# Patient Record
Sex: Female | Born: 1980 | Race: White | Hispanic: No | Marital: Married | Smoking: Former smoker
Health system: Southern US, Community
[De-identification: ages and names within clinical notes are randomized; demographics above are authoritative.]

## PROBLEM LIST (undated history)

## (undated) ENCOUNTER — Inpatient Hospital Stay (HOSPITAL_COMMUNITY): Payer: Self-pay

## (undated) DIAGNOSIS — E079 Disorder of thyroid, unspecified: Secondary | ICD-10-CM

## (undated) DIAGNOSIS — E039 Hypothyroidism, unspecified: Secondary | ICD-10-CM

## (undated) DIAGNOSIS — R001 Bradycardia, unspecified: Secondary | ICD-10-CM

## (undated) DIAGNOSIS — H409 Unspecified glaucoma: Secondary | ICD-10-CM

## (undated) DIAGNOSIS — J45909 Unspecified asthma, uncomplicated: Secondary | ICD-10-CM

## (undated) DIAGNOSIS — F419 Anxiety disorder, unspecified: Secondary | ICD-10-CM

## (undated) HISTORY — PX: BREAST SURGERY: SHX581

## (undated) HISTORY — DX: Unspecified glaucoma: H40.9

## (undated) HISTORY — PX: OTHER SURGICAL HISTORY: SHX169

---

## 2014-05-14 ENCOUNTER — Emergency Department (HOSPITAL_COMMUNITY)
Admission: EM | Admit: 2014-05-14 | Discharge: 2014-05-14 | Disposition: A | Discharge status: Home / Self Care | Payer: Medicaid Other | Attending: Emergency Medicine | Admitting: Emergency Medicine

## 2014-05-14 DIAGNOSIS — R109 Unspecified abdominal pain: Secondary | ICD-10-CM | POA: Diagnosis present

## 2014-05-14 DIAGNOSIS — R197 Diarrhea, unspecified: Secondary | ICD-10-CM

## 2014-05-14 DIAGNOSIS — Z3202 Encounter for pregnancy test, result negative: Secondary | ICD-10-CM | POA: Insufficient documentation

## 2014-05-14 LAB — COMPREHENSIVE METABOLIC PANEL
ALBUMIN: 3.5 g/dL (ref 3.5–5.2)
ALK PHOS: 94 U/L (ref 39–117)
ALT: 10 U/L (ref 0–35)
ANION GAP: 10 (ref 5–15)
AST: 14 U/L (ref 0–37)
BUN: 11 mg/dL (ref 6–23)
CO2: 23 mEq/L (ref 19–32)
Calcium: 8.9 mg/dL (ref 8.4–10.5)
Chloride: 105 mEq/L (ref 96–112)
Creatinine, Ser: 0.76 mg/dL (ref 0.50–1.10)
GFR calc non Af Amer: >90 mL/min (ref >90)
GLUCOSE: 77 mg/dL (ref 70–99)
POTASSIUM: 4.4 meq/L (ref 3.7–5.3)
SODIUM: 138 meq/L (ref 137–147)
TOTAL PROTEIN: 6.9 g/dL (ref 6.0–8.3)
Total Bilirubin: <0.2 mg/dL — ABNORMAL LOW (ref 0.3–1.2)

## 2014-05-14 LAB — CBC WITH DIFFERENTIAL/PLATELET
BASOS PCT: 0 % (ref 0–1)
Basophils Absolute: 0 10*3/uL (ref 0.0–0.1)
EOS ABS: 0.3 10*3/uL (ref 0.0–0.7)
EOS PCT: 3 % (ref 0–5)
HCT: 41.1 % (ref 36.0–46.0)
Hemoglobin: 13.6 g/dL (ref 12.0–15.0)
Lymphocytes Relative: 37 % (ref 12–46)
Lymphs Abs: 4 10*3/uL (ref 0.7–4.0)
MCH: 30 pg (ref 26.0–34.0)
MCHC: 33.1 g/dL (ref 30.0–36.0)
MCV: 90.5 fL (ref 78.0–100.0)
Monocytes Absolute: 0.6 10*3/uL (ref 0.1–1.0)
Monocytes Relative: 6 % (ref 3–12)
NEUTROS PCT: 54 % (ref 43–77)
Neutro Abs: 5.8 10*3/uL (ref 1.7–7.7)
PLATELETS: 183 10*3/uL (ref 150–400)
RBC: 4.54 MIL/uL (ref 3.87–5.11)
RDW: 13.8 % (ref 11.5–15.5)
WBC: 10.8 10*3/uL — ABNORMAL HIGH (ref 4.0–10.5)

## 2014-05-14 LAB — URINE MICROSCOPIC-ADD ON

## 2014-05-14 LAB — LIPASE, BLOOD: Lipase: 23 U/L (ref 11–59)

## 2014-05-14 LAB — URINALYSIS, ROUTINE W REFLEX MICROSCOPIC
Bilirubin Urine: NEGATIVE
GLUCOSE, UA: NEGATIVE mg/dL
Ketones, ur: NEGATIVE mg/dL
Nitrite: NEGATIVE
PROTEIN: NEGATIVE mg/dL
Specific Gravity, Urine: 1.017 (ref 1.005–1.030)
UROBILINOGEN UA: 0.2 mg/dL (ref 0.0–1.0)
pH: 5.5 (ref 5.0–8.0)

## 2014-05-14 LAB — PREGNANCY, URINE: Preg Test, Ur: NEGATIVE

## 2014-05-14 LAB — TSH: TSH: 9.94 u[IU]/mL — AB (ref 0.350–4.500)

## 2014-05-14 LAB — T4, FREE: Free T4: 1.23 ng/dL (ref 0.80–1.80)

## 2014-05-14 MED ORDER — SODIUM CHLORIDE 0.9 % IV BOLUS (SEPSIS)
1000.0000 mL | Freq: Once | INTRAVENOUS | Status: AC
  Administered 2014-05-14: 1000 mL via INTRAVENOUS

## 2014-05-14 NOTE — ED Notes (Signed)
TO Ed via private vehicle with c/o abd pain and diarrhea.

## 2014-05-14 NOTE — ED Notes (Signed)
Pt states does not want to have a pelvic exam due to religious reasons- states husband is Muslim and he is not here. Denies any vaginal discharge or itching.

## 2014-05-14 NOTE — ED Provider Notes (Signed)
CSN: [id number]     Arrival date & time 05/14/14  0827 [id number]   First MD Initiated Contact with Patient 05/14/14 [phone]     Chief Complaint  Patient presents with  . Abdominal Pain     (Consider location/radiation/quality/duration/timing/severity/associated sxs/prior Treatment) HPI  [name] is a 33 year old female with a past medical history hypothyroidism asthma glaucoma and anxiety coming in with abdominal cramping fever and diarrhea for the past 2 days. Patient states 2 days ago she took a trip to [place] family. She denies eating any bad food prior to onset of symptoms. She denies any other member of her family having similar symptoms. She states that she has bilateral lower quadrant cramping however she's also on her menstrual cycle. She states the cramping is normal is worse than her normal menstrual cycle but her bleeding has not changed. Patient denies any radiation to the pain nothing makes the symptoms better or worse she denies any nausea or vomiting. Patient states she is 5-6 episodes of diarrhea per day that are nonbloody. She states her fever was as high as 101.9 this morning and she took a Motrin at that time. She went to work and had a couple of episodes of bowel movements and they told her to come to emergency department for evaluation. Patient states he has had unprotected sex during the interval with her husband. She denies any history of sexually transmitted infections. Review systems is negative for recent infections recent antibiotic use headache chest pain shortness of breath pain is minor bilateral lower extremities.  Past medical history hypothyroidism anxiety asthma glaucoma. Past surgical history pilodinal abscess removal removal x2.  Medications include Synthroid Celexa. Patient is allergic to penicillin. Patient is a primary care physician. Patient smokes half a pack per day denies abuse of alcohol or illicit drugs.  No past medical history on file. No  past surgical history on file. No family history on file. History  Substance Use Topics  . Smoking status: Not on file  . Smokeless tobacco: Not on file  . Alcohol Use: Not on file   OB History   No data available     Review of Systems 10 Systems reviewed and are negative for acute change except as noted in the HPI.    Allergies  Review of patient's allergies indicates not on file.  Home Medications   Prior to Admission medications   Not on File   BP 106/78  Pulse 77  Temp(Src) 98.5 F (36.9 C) (Oral)  Resp 19  Ht 5\' 3"  (1.6 m)  Wt 175 lb (79.379 kg)  BMI 31.01 kg/m2  SpO2 100% Physical Exam  Nursing note and vitals reviewed. Constitutional: She appears well-developed and well-nourished. No distress.  HENT:  Head: Normocephalic and atraumatic.  Nose: Nose normal.  Mouth/Throat: Oropharynx is clear and moist. No oropharyngeal exudate.  Eyes: Conjunctivae and EOM are normal. Pupils are equal, round, and reactive to light.  Neck: Normal range of motion. Neck supple. No tracheal deviation present. No thyromegaly present.  Cardiovascular: Normal rate, regular rhythm and normal heart sounds.   No murmur heard. Pulmonary/Chest: Effort normal and breath sounds normal. No respiratory distress. She has no wheezes. She has no rales. She exhibits no tenderness.  Abdominal: Soft. There is no rebound and no guarding.  Bilateral lower quadrant tenderness to palpation  Musculoskeletal: Normal range of motion. She exhibits no edema and no tenderness.  Lymphadenopathy:    She has no cervical adenopathy.  Skin: Skin is  warm. No rash noted. She is not diaphoretic. No erythema. No pallor.    ED Course  Procedures (including critical care time) Labs Review Labs Reviewed  CBC WITH DIFFERENTIAL - Abnormal; Notable for the following:    WBC 10.8 (*)    All other components within normal limits  COMPREHENSIVE METABOLIC PANEL - Abnormal; Notable for the following:    Total Bilirubin  <0.2 (*)    All other components within normal limits  URINALYSIS, ROUTINE W REFLEX MICROSCOPIC - Abnormal; Notable for the following:    Color, Urine RED (*)    APPearance CLOUDY (*)    Hgb urine dipstick LARGE (*)    Leukocytes, UA SMALL (*)    All other components within normal limits  WET PREP, GENITAL  GC/CHLAMYDIA PROBE AMP  LIPASE, BLOOD  PREGNANCY, URINE  URINE MICROSCOPIC-ADD ON  TSH  T4, FREE    Imaging Review No results found.   EKG Interpretation None      MDM   Final diagnoses:  None    [name] is a 33 year old female with a past medical history of high thyroidism coming in with bilateral lower quadrant abdominal cramping fever and diarrhea for the past 2 days. Patient is currently on her menstrual cycle and states that her cramping is slightly worse than her normal menstrual cycle. Her bleeding has been normal. Patient remained afebrile here in the emergency department. She stated her fever this morning was 101.9 however here she has not spiked a temperature without any medication given to an emergency department. On exam patient did have some mild tenderness to her bilateral lower quadrants as we obtained laboratory studies urinalysis and pregnancy test. Results as above did not show any significant finding to explain her abdominal cramping and diarrhea. I do not believe that this is a bacterial infection this is likely a viral enteritis. Patient has a mildly elevated white count at 10.8 which is not specific for an acute infection. Patient was given a liter of IV fluids. Pelvic exam on this patient was deferred as she refused the exam. Says that her husband is Muslim and did not want to interfere with any of her cultural customs. I told her that I would not do it diagnosed any sexually transmitted infections nor could I assessed her cervix and adnexa probably about the exam. Pregnancy test was negative. Patient was okay with refusing a pelvic exam at this time.  Current labs pending right now is a TSH and a free T4. All of her of these results prior to discharge. I anticipate the patient will be able to be discharged home and follow up with her primary care physician within a week for continued care. Patient states she has all her home meds at home and did not need any refills. [place] is her primary care physician so ago her referral numbers to obtain one here.  I spoke with the lab and they stated that the laboratory machine for TSH reading is currently down we'll not be on for another couple of hours. The plan will be to discharge the patient and she will be contacted in case these lab values to return abnormal. Patient was encouraged to continue her Synthroid at home and obtain a primary care physician for continued care. Patient was amenable with this plan.  Tomasita Crumble, MD 05/14/14 1154

## 2014-05-14 NOTE — Discharge Instructions (Signed)
Linda Cortez, you were seen today for abdominal pain and diarrhea.  You did not have a fever while in the emergency department.  If it returns, take tylenol or motrin at home.  Follow up with a regular doctor within 2 days for repeat evaluation.  If your symptoms worsen or you develop blood in your stool, return to the ED immediately for repeat evaluation.  Be sure to stay well hydrated until your diarrhea resolves.    Thank you.     Emergency Department Resource Guide 1) Find a Doctor and Pay Out of Pocket Although you won't have to find out who is covered by your insurance plan, it is a good idea to ask around and get recommendations. You will then need to call the office and see if the doctor you have chosen will accept you as a new patient and what types of options they offer for patients who are self-pay. Some doctors offer discounts or will set up payment plans for their patients who do not have insurance, but you will need to ask so you aren't surprised when you get to your appointment.  2) Contact Your Local Health Department Not all health departments have doctors that can see patients for sick visits, but many do, so it is worth a call to see if yours does. If you don't know where your local health department is, you can check in your phone book. The CDC also has a tool to help you locate your state's health department, and many state websites also have listings of all of their local health departments.  3) Find a Walk-in Clinic If your illness is not likely to be very severe or complicated, you may want to try a walk in clinic. These are popping up all over the country in pharmacies, drugstores, and shopping centers. They're usually staffed by nurse practitioners or physician assistants that have been trained to treat common illnesses and complaints. They're usually fairly quick and inexpensive. However, if you have serious medical issues or chronic medical problems, these are probably not  your best option.  No Primary Care Doctor: - Call Health Connect at  [phone] - they can help you locate a primary care doctor that  accepts your insurance, provides certain services, etc. - Physician Referral Service- [phone]  Chronic Pain Problems: Organization         Address  Phone   Notes  [facility]  [phone] Patients need to be referred by their primary care doctor.   Medication Assistance: Organization         Address  Phone   Notes  [facility] Medication [facility] [address] [place], [place] [address] [phone] --Must be a resident of [facility] -- Must have NO insurance coverage whatsoever (no Medicaid/ Medicare, etc.) -- The pt. MUST have a primary care doctor that directs their care regularly and follows them in the community   [facility]  [phone]   [employer]  [phone]    Agencies that provide inexpensive medical care: Organization         Address  Phone   Notes  [facility]  [phone]   [facility]    [phone]   [facility] [address] [place], [place] [address] [phone]   [facility] [address] [place] [place] [phone]   Planned Parenthood    [phone]  [facility]    [phone]   Community [facility] and [facility]  [address], [place] Phone:  [phone], Fax:  [phone] Hours of Operation:  9 am - 6 pm, M-F.  Also accepts Medicaid/Medicare and self-pay.  [facility]  [address], [place] Phone: [phone], Fax: [phone]. Hours of Operation:  8:30 am - 5:30 pm, M-F.  Also accepts Medicaid and self-pay.  [facility] [place] [address] [place] Phone: [phone]   [facility] [address] Natasha Bence [place], [place] [phone], Ext. [phone] Mondays & Thursdays: 7-9 AM.  First  15 patients are seen on a first come, first serve basis.    Medicaid-accepting [facility] Providers:  Organization         Address  Phone   Notes  [facility] [address], [place] [phone] Also accepts self-pay patients.  [facility] [address] Laurell Josephs [place], [place]  [phone]   [facility] [address], [place] [phone]   [facility] [address] [place] [phone]   Renaye Rakers [address], [place]   [phone] Only accepts [place] Access [place] patients after they have their name applied to their card.   Self-Pay (no insurance) in [facility]:  Organization         Address  Phone   Notes  Sickle Cell Patients, [facility] [address] [place], [place] [phone]   [facility] [address] [place], [place] [phone]   [facility] [place]  [address], [place] [phone]   [facility]/Dr. Osei-Bonsu  [address] [place] or [address], [place] [phone] Phone number for both [place] and [place] locations is the same.  Urgent Medical and [facility] [address], [place] [phone]   [facility] [address], [place] or [address] [phone]   [facility] [address] [place] [phone], phone; [phone], fax Sees patients 1st and 3rd Saturday of every month.  Must not qualify for public or private insurance (i.e. Medicaid, Medicare, [place] Health Choice, Veterans' Benefits)  Household income should be no more than 200% of the poverty level The clinic cannot treat you if you are pregnant or think you are pregnant  Sexually transmitted diseases are not treated at the clinic.   Dental  Care: Organization         Address  Phone  Notes  [facility] Department of [facility] [address] [place], [place] [phone] Accepts children up to age 59 who are enrolled in [place] or [place] Health Choice; pregnant women with a Medicaid card; and children who have applied for Medicaid or [place] Health Choice, but were declined, whose parents can pay a reduced fee at time of service.  [facility] Department of [facility]  [address], [place] [phone] Accepts children up to age 82 who are enrolled in [place] or [place] Health Choice; pregnant women with a Medicaid card; and children who have applied for Medicaid or [place] Health Choice, but were declined, whose parents can pay a reduced fee at time of  service.  [facility]  [address], [place] [phone] Patients are seen by appointment only. Walk-ins are not accepted. [facility] will see patients 35 years of age and older. Monday - Tuesday (8am-5pm) Most Wednesdays (8:30-5pm) $30 per visit, cash only  [facility]  [address], [facility] [phone] Patients are seen by appointment only. Walk-ins are not accepted. [facility] will see patients 35 years of age and older. One Wednesday Evening (Monthly: Volunteer Based).  $30 per visit, cash only  [employer] [facility]  [phone] for adults; Children under age 354, call Graduate Pediatric Dentistry at [phone]. Children aged 594-14, please call [phone] to request a pediatric application.  Dental services are provided in all areas of dental care including fillings, crowns and bridges, complete and partial dentures, implants, gum treatment, root canals, and extractions. Preventive care is also provided. Treatment is provided to both adults and children. Patients are selected via a lottery and there is often a waiting list.   [facility] [address], [place]  [phone] [email or web address]   [facility] [address], [place], [place] [phone], Ext. [phone] Second and Fourth Thursday of each month, opens at 6:30 AM; Clinic ends at 9 AM.  Patients are seen on a first-come first-served basis, and a limited number are seen during each clinic.   [facility]  [address], [place], [place] [phone]   Eligibility Requirements You must have lived in [place], [place], or [place] for at least the last three months.   You cannot be eligible for state or federal sponsored [place]healthcare insurance, including CIGNAVeterans Administration, [place]icaid, or [employer]icare.   You generally cannot be eligible for healthcare insurance through your employer.    How to apply: Eligibility screenings are held every Tuesday and Wednesday afternoon from 1:00 pm until 4:00 pm. You do not need an appointment for the interview!  [facility] [address], [place], [place] [phone]   [facility]  [phone]   [facility]  [phone]   [facility]  [phone]    Behavioral Health Resources in the Community: Intensive Outpatient Programs Organization         Address  Phone  Notes  [facility] [address], [place], [place] [phone]   [facility] [address], [place], [place] [phone]   [facility] [address], [place], [place]  [phone]   [facility] [address],  [place], [place] [phone] or [phone]   Substance Abuse Resources Organization         Address  Phone  Notes  [facility]  [phone]   [facility]  [phone]   [facility]  [phone]   [facility]  [phone]   Residential & Outpatient Substance Abuse Program  [phone]    Psychological Services Organization         Address  Phone  Notes  [facility]  [phone]- [phone]   [facility]  [phone]- [phone]   [facility] [address], [place] [phone] or [phone]    Mobile Crisis Teams Organization         Address  Phone  Notes  [facility], Mobile Crisis Care Unit  [phone]   [facility]  [address] Gin[place], [place] [phone]   [facility] [address] [place] [phone]    Self-Help/Support Groups Organization         Address  Phone             Notes  [facility] [place] - variety of support groups  [phone]- [phone] Call for more information  Narcotics Anonymous (NA), [facility] [address], [employer] [place]  2 meetings at this location   Statistician         Address  Phone  Notes  [facility] [address],    [place]  [phone]   [facility]  [address], [place] [address]696, [place], [place] [phone]   [facility] [address] [place], [place] [phone] Admissions: 8am-3pm M-F  [facility] [address],    [place], [place] [phone]   [facility] [address] [place], [place], [place] [phone]   [facility] [address]  [place], [place] [phone]   [facility] - Intensive Outpatient [address] Laurell Joseph[address], [place], [place] [phone]   [facility] ([facility] [address] [place]  [place], [place] [phone] or [phone]   [facility] ([facility]) [address] [place], [place] [phone] Accepts Medicaid  [facility] [place] [address],  [place] [phone] Substance Abuse/Addiction Treatment   [facility] Organization         Address  Phone  Notes  [facility]  [phone]   Angie Fava, PhD [address] Ervin Knack [place], [place]   [phone] or [phone]   [facility] Behavioral   [address] [place], [place] [phone]   [facility] [address], [place], [place] [phone] Insurance/Medicaid/sponsorship through [facility] Families [address]                                    [place], [place] [phone] Therapy/tele-psych/case  [facility] [address] [place] [address], [place] [phone]    Dr. Lolly Mustache  [phone]   [facility]  [employer] [facility] Dept. 1) [address], [place] 2) [address] [place] 3)  [address], [place] [phone]  [phone]   [facility] Child Abuse Hotline ([phone] or [phone] (After Hours)        Abdominal Pain Many things can cause belly (abdominal) pain. Most times, the belly pain is not dangerous. Many cases of belly pain can be watched and treated at home. HOME CARE   Do not take medicines that help you go poop (laxatives) unless told to by your doctor.  Only take medicine as told by your doctor.  Eat or drink as told by your doctor. Your doctor will tell you if you should be on a special diet. GET HELP IF:  You do not know what is causing your belly pain.  You have belly pain while you are sick to your stomach (nauseous) or have runny poop (diarrhea).  You have pain while you pee or poop.  Your belly pain wakes you up at night.  You have belly pain that gets worse or better when you eat.  You  have belly pain that gets worse when you eat fatty foods.  You have a fever. GET HELP RIGHT AWAY IF:   The pain does not go away within 2 hours.  You keep throwing up (vomiting).  The pain changes and is only in the right or left part of the belly.  You have bloody or tarry looking poop. MAKE SURE YOU:   Understand these instructions.  Will  watch your condition.  Will get help right away if you are not doing well or get worse. Document Released: 03/08/2008 Document Revised: 09/25/2013 Document Reviewed: 05/30/2013 [facility] Patient Information 2015 [place], [place]. This information is not intended to replace advice given to you by your health care provider. Make sure you discuss any questions you have with your health care provider. Diarrhea Diarrhea is watery poop (stool). It can make you feel weak, tired, thirsty, or give you a dry mouth (signs of dehydration). Watery poop is a sign of another problem, most often an infection. It often lasts 2-3 days. It can last longer if it is a sign of something serious. Take care of yourself as told by your doctor. HOME CARE   Drink 1 cup (8 ounces) of fluid each time you have watery poop.  Do not drink the following fluids:  Those that contain simple sugars (fructose, glucose, galactose, lactose, sucrose, maltose).  Sports drinks.  Fruit juices.  Whole milk products.  Sodas.  Drinks with caffeine (coffee, tea, soda) or alcohol.  Oral rehydration solution may be used if the doctor says it is okay. You may make your own solution. Follow this recipe:   - teaspoon table salt.   teaspoon baking soda.   teaspoon salt substitute containing potassium chloride.  1 tablespoons sugar.  1 liter (34 ounces) of water.  Avoid the following foods:  High fiber foods, such as raw fruits and vegetables.  Nuts, seeds, and whole grain breads and cereals.   Those that are sweetened with sugar alcohols (xylitol, sorbitol, mannitol).  Try eating the following foods:  Starchy foods, such as rice, toast, pasta, low-sugar cereal, oatmeal, baked potatoes, crackers, and bagels.  Bananas.  Applesauce.  Eat probiotic-rich foods, such as yogurt and milk products that are fermented.  Wash your hands well after each time you have watery poop.  Only take medicine as told by your  doctor.  Take a warm bath to help lessen burning or pain from having watery poop. GET HELP RIGHT AWAY IF:   You cannot drink fluids without throwing up (vomiting).  You keep throwing up.  You have blood in your poop, or your poop looks black and tarry.  You do not pee (urinate) in 6-8 hours, or there is only a small amount of very dark pee.  You have belly (abdominal) pain that gets worse or stays in the same spot (localizes).  You are weak, dizzy, confused, or light-headed.  You have a very bad headache.  Your watery poop gets worse or does not get better.  You have a fever or lasting symptoms for more than 2-3 days.  You have a fever and your symptoms suddenly get worse. MAKE SURE YOU:   Understand these instructions.  Will watch your condition.  Will get help right away if you are not doing well or get worse. Document Released: 03/08/2008 Document Revised: 02/04/2014 Document Reviewed: 05/28/2012 [facility] Patient Information 2015 [place], [place]. This information is not intended to replace advice given to you by your health care provider. Make sure you  discuss any questions you have with your health care provider. ° °

## 2014-07-20 ENCOUNTER — Emergency Department (HOSPITAL_COMMUNITY)
Admission: EM | Admit: 2014-07-20 | Discharge: 2014-07-20 | Disposition: A | Discharge status: Home / Self Care | Payer: Medicaid Other | Attending: Emergency Medicine | Admitting: Emergency Medicine

## 2014-07-20 ENCOUNTER — Encounter (HOSPITAL_COMMUNITY): Payer: Self-pay | Admitting: Emergency Medicine

## 2014-07-20 ENCOUNTER — Emergency Department (HOSPITAL_COMMUNITY): Payer: Medicaid Other

## 2014-07-20 DIAGNOSIS — O200 Threatened abortion: Secondary | ICD-10-CM | POA: Diagnosis not present

## 2014-07-20 DIAGNOSIS — O99331 Smoking (tobacco) complicating pregnancy, first trimester: Secondary | ICD-10-CM | POA: Diagnosis not present

## 2014-07-20 DIAGNOSIS — R102 Pelvic and perineal pain: Secondary | ICD-10-CM | POA: Insufficient documentation

## 2014-07-20 DIAGNOSIS — E079 Disorder of thyroid, unspecified: Secondary | ICD-10-CM | POA: Diagnosis not present

## 2014-07-20 DIAGNOSIS — R1031 Right lower quadrant pain: Secondary | ICD-10-CM | POA: Insufficient documentation

## 2014-07-20 DIAGNOSIS — O209 Hemorrhage in early pregnancy, unspecified: Secondary | ICD-10-CM | POA: Diagnosis present

## 2014-07-20 DIAGNOSIS — F17200 Nicotine dependence, unspecified, uncomplicated: Secondary | ICD-10-CM | POA: Insufficient documentation

## 2014-07-20 DIAGNOSIS — Z880 Allergy status to penicillin: Secondary | ICD-10-CM | POA: Insufficient documentation

## 2014-07-20 DIAGNOSIS — O26851 Spotting complicating pregnancy, first trimester: Secondary | ICD-10-CM

## 2014-07-20 DIAGNOSIS — Z3A01 Less than 8 weeks gestation of pregnancy: Secondary | ICD-10-CM | POA: Diagnosis not present

## 2014-07-20 DIAGNOSIS — R1032 Left lower quadrant pain: Secondary | ICD-10-CM | POA: Insufficient documentation

## 2014-07-20 DIAGNOSIS — Z79899 Other long term (current) drug therapy: Secondary | ICD-10-CM | POA: Insufficient documentation

## 2014-07-20 DIAGNOSIS — O9989 Other specified diseases and conditions complicating pregnancy, childbirth and the puerperium: Secondary | ICD-10-CM | POA: Diagnosis not present

## 2014-07-20 DIAGNOSIS — Z3490 Encounter for supervision of normal pregnancy, unspecified, unspecified trimester: Secondary | ICD-10-CM

## 2014-07-20 HISTORY — DX: Disorder of thyroid, unspecified: E07.9

## 2014-07-20 LAB — URINALYSIS, ROUTINE W REFLEX MICROSCOPIC
Bilirubin Urine: NEGATIVE
Glucose, UA: NEGATIVE mg/dL
HGB URINE DIPSTICK: NEGATIVE
Ketones, ur: NEGATIVE mg/dL
LEUKOCYTES UA: NEGATIVE
Nitrite: NEGATIVE
PH: 6 (ref 5.0–8.0)
Protein, ur: NEGATIVE mg/dL
Specific Gravity, Urine: 1.01 (ref 1.005–1.030)
Urobilinogen, UA: 0.2 mg/dL (ref 0.0–1.0)

## 2014-07-20 LAB — PREGNANCY, URINE: Preg Test, Ur: POSITIVE — AB

## 2014-07-20 LAB — CBC WITH DIFFERENTIAL/PLATELET
Basophils Absolute: 0 10*3/uL (ref 0.0–0.1)
Basophils Relative: 0 % (ref 0–1)
EOS ABS: 0.2 10*3/uL (ref 0.0–0.7)
Eosinophils Relative: 2 % (ref 0–5)
HEMATOCRIT: 36.4 % (ref 36.0–46.0)
HEMOGLOBIN: 12.4 g/dL (ref 12.0–15.0)
Lymphocytes Relative: 39 % (ref 12–46)
Lymphs Abs: 3.8 10*3/uL (ref 0.7–4.0)
MCH: 29.2 pg (ref 26.0–34.0)
MCHC: 34.1 g/dL (ref 30.0–36.0)
MCV: 85.8 fL (ref 78.0–100.0)
MONO ABS: 0.6 10*3/uL (ref 0.1–1.0)
MONOS PCT: 6 % (ref 3–12)
NEUTROS PCT: 53 % (ref 43–77)
Neutro Abs: 5.1 10*3/uL (ref 1.7–7.7)
Platelets: 198 10*3/uL (ref 150–400)
RBC: 4.24 MIL/uL (ref 3.87–5.11)
RDW: 13 % (ref 11.5–15.5)
WBC: 9.7 10*3/uL (ref 4.0–10.5)

## 2014-07-20 LAB — WET PREP, GENITAL
Clue Cells Wet Prep HPF POC: NONE SEEN
TRICH WET PREP: NONE SEEN
Yeast Wet Prep HPF POC: NONE SEEN

## 2014-07-20 LAB — ABO/RH: ABO/RH(D): O POS

## 2014-07-20 LAB — HCG, QUANTITATIVE, PREGNANCY: hCG, Beta Chain, Quant, S: 15607 m[IU]/mL — ABNORMAL HIGH (ref <5)

## 2014-07-20 LAB — OB RESULTS CONSOLE GC/CHLAMYDIA
Chlamydia: NEGATIVE
GC PROBE AMP, GENITAL: NEGATIVE

## 2014-07-20 NOTE — Discharge Instructions (Signed)
Call an OB for further evaluation of your pregnancy. Call for a follow up appointment with a Family or Primary Care Provider.  Return if Symptoms worsen.   Take medication as prescribed.  Pelvic rest for 1 week.

## 2014-07-20 NOTE — ED Provider Notes (Signed)
CSN: [id number]     Arrival date & time 07/20/14  0901 History   First MD Initiated Contact with Patient 07/20/14 [phone]     Chief Complaint  Patient presents with  . Vaginal Bleeding  . Possible Pregnancy     (Consider location/radiation/quality/duration/timing/severity/associated sxs/prior Treatment) HPI Comments: The patient is a 33 year old 125P3A1 female presenting to emergency room at with a chief complaint of abnormal vaginal bleeding since yesterday. The patient reports spotting yesterday with lower abdominal cramping suprapubic and left lower and right lower quadrants. The patient reports mild spotting on toilet paper today. With intermittent abdominal discomfort. Patient reports positive pregnancy test approximately 1.5 weeks ago. Pt unsure last normal menstrual period questionable end of August, early September (6-7 weeks ago). No prenatal care, or immaging.  Patient is a 33 y.o. female presenting with vaginal bleeding and pregnancy problem. The history is provided by the patient. No language interpreter was used.  Vaginal Bleeding Associated symptoms: no dysuria and no nausea   Possible Pregnancy  Primary symptoms include vaginal bleeding.  Associated symptoms include no dysuria, no nausea and no vomiting.    Past Medical History  Diagnosis Date  . Thyroid disease    History reviewed. No pertinent past surgical history. History reviewed. No pertinent family history. History  Substance Use Topics  . Smoking status: Current Every Day Smoker  . Smokeless tobacco: Not on file  . Alcohol Use: No   OB History   Grav Para Term Preterm Abortions TAB SAB Ect Mult Living                 Review of Systems  Gastrointestinal: Negative for nausea, vomiting and diarrhea.  Genitourinary: Positive for vaginal bleeding and pelvic pain. Negative for dysuria.      Allergies  Augmentin and Penicillins  Home Medications   Prior to Admission medications   Medication Sig Start  Date End Date Taking? Authorizing Provider  albuterol (PROVENTIL HFA;VENTOLIN HFA) 108 (90 BASE) MCG/ACT inhaler Inhale 2 puffs into the lungs every 6 (six) hours as needed for wheezing or shortness of breath.   Yes Historical Provider, MD  levothyroxine (SYNTHROID, LEVOTHROID) 175 MCG tablet Take 175 mcg by mouth daily before breakfast.   Yes Historical Provider, MD   BP 116/56  Pulse 68  Temp(Src) 97.8 F (36.6 C) (Oral)  Resp 16  SpO2 100%  LMP 06/04/2014 Physical Exam  Nursing note and vitals reviewed. Constitutional: She is oriented to person, place, and time. She appears well-developed and well-nourished. No distress.  HENT:  Head: Atraumatic.  Cardiovascular: Normal rate and regular rhythm.   Pulmonary/Chest: Effort normal and breath sounds normal. She has no wheezes. She has no rales.  Abdominal: Soft. There is tenderness. There is no rebound and no guarding.  Genitourinary: Cervix exhibits no motion tenderness. Right adnexum displays no mass and no tenderness. Left adnexum displays no mass and no tenderness. No bleeding around the vagina. Vaginal discharge found.  Scant amount of white discharge in posterior vaginal vault. Normal appearance cervix, cervical os is closed. No obvious bleeding. Chaperone present.   Musculoskeletal: Normal range of motion.  Neurological: She is alert and oriented to person, place, and time.  Skin: Skin is warm and dry.  Psychiatric: She has a normal mood and affect. Her behavior is normal.    ED Course  Procedures (including critical care time) Labs Review Results for orders placed during the hospital encounter of 07/20/14  WET PREP, GENITAL      Result Value  Ref Range   Yeast Wet Prep HPF POC NONE SEEN  NONE SEEN   Trich, Wet Prep NONE SEEN  NONE SEEN   Clue Cells Wet Prep HPF POC NONE SEEN  NONE SEEN   WBC, Wet Prep HPF POC FEW (*) NONE SEEN  PREGNANCY, URINE      Result Value Ref Range   Preg Test, Ur POSITIVE (*) NEGATIVE  URINALYSIS,  ROUTINE W REFLEX MICROSCOPIC      Result Value Ref Range   Color, Urine YELLOW  YELLOW   APPearance CLEAR  CLEAR   Specific Gravity, Urine 1.010  1.005 - 1.030   pH 6.0  5.0 - 8.0   Glucose, UA NEGATIVE  NEGATIVE mg/dL   Hgb urine dipstick NEGATIVE  NEGATIVE   Bilirubin Urine NEGATIVE  NEGATIVE   Ketones, ur NEGATIVE  NEGATIVE mg/dL   Protein, ur NEGATIVE  NEGATIVE mg/dL   Urobilinogen, UA 0.2  0.0 - 1.0 mg/dL   Nitrite NEGATIVE  NEGATIVE   Leukocytes, UA NEGATIVE  NEGATIVE  HCG, QUANTITATIVE, PREGNANCY      Result Value Ref Range   hCG, Beta Chain, Quant, S 15607 (*) <5 mIU/mL  CBC WITH DIFFERENTIAL      Result Value Ref Range   WBC 9.7  4.0 - 10.5 K/uL   RBC 4.24  3.87 - 5.11 MIL/uL   Hemoglobin 12.4  12.0 - 15.0 g/dL   HCT 45.4  09.8 - 11.9 %   MCV 85.8  78.0 - 100.0 fL   MCH 29.2  26.0 - 34.0 pg   MCHC 34.1  30.0 - 36.0 g/dL   RDW 14.7  82.9 - 56.2 %   Platelets 198  150 - 400 K/uL   Neutrophils Relative % 53  43 - 77 %   Neutro Abs 5.1  1.7 - 7.7 K/uL   Lymphocytes Relative 39  12 - 46 %   Lymphs Abs 3.8  0.7 - 4.0 K/uL   Monocytes Relative 6  3 - 12 %   Monocytes Absolute 0.6  0.1 - 1.0 K/uL   Eosinophils Relative 2  0 - 5 %   Eosinophils Absolute 0.2  0.0 - 0.7 K/uL   Basophils Relative 0  0 - 1 %   Basophils Absolute 0.0  0.0 - 0.1 K/uL  ABO/RH      Result Value Ref Range   ABO/RH(D) O POS     No rh immune globuloin NOT A RH IMMUNE GLOBULIN CANDIDATE, PT RH POSITIVE     US Ob Comp Less 14 Wks  07/20/2014   CLINICAL DATA:  Pregnant patient with spotting. Quantitative HCG 15,607.  EXAM: OBSTETRIC <14 WK [place] AND TRANSVAGINAL OB US  TECHNIQUE: Both transabdominal and transvaginal ultrasound examinations were performed for complete evaluation of the gestation as well as the maternal uterus, adnexal regions, and pelvic cul-de-sac. Transvaginal technique was performed to assess early pregnancy.  COMPARISON:  None.  FINDINGS: Intrauterine gestational sac: Visualized/normal  in shape. No subchorionic hemorrhage is visualized.  Yolk sac:  Visualized.  Embryo:  Visualized.  Cardiac Activity: Detected.  Heart Rate:  126 bpm  CRL:   5.2  mm   6 w 2 d                  [place] EDC: 03/13/2015  Maternal uterus/adnexae: Unremarkable.  IMPRESSION: Single live intrauterine pregnancy.  No acute finding.   Electronically Signed   By: Drusilla Kanner M.D.   On: 07/20/2014 12:31  [place] Ob Transvaginal  07/20/2014   CLINICAL DATA:  Pregnant patient with spotting. Quantitative HCG 15,607.  EXAM: OBSTETRIC <14 WK US AND TRANSVAGINAL OB US  TECHNIQUE: Both transabdominal and transvaginal ultrasound examinations were performed for complete evaluation of the gestation as well as the maternal uterus, adnexal regions, and pelvic cul-de-sac. Transvaginal technique was performed to assess early pregnancy.  COMPARISON:  None.  FINDINGS: Intrauterine gestational sac: Visualized/normal in shape. No subchorionic hemorrhage is visualized.  Yolk sac:  Visualized.  Embryo:  Visualized.  Cardiac Activity: Detected.  Heart Rate:  126 bpm  CRL:   5.2  mm   6 w 2 d                  [place] EDC: 03/13/2015  Maternal uterus/adnexae: Unremarkable.  IMPRESSION: Single live intrauterine pregnancy.  No acute finding.   Electronically Signed   By: Drusilla Kannerhomas  Dalessio M.D.   On: 07/20/2014 12:31   Imaging Review No results found.   EKG Interpretation None      MDM   Final diagnoses:  Threatened miscarriage in early pregnancy  Intrauterine pregnancy   Patient's with unconfirmed location of pregnancy presents with abnormal vaginal bleeding and lower abdominal pain since yesterday.  Labs, ultrasound ordered. UA shows positive pregnancy, pelvic without obvious bleeding, cervix closed. Beta hCG 15 607, ABO Rh O+ will not administer Rhogam.  Ultrasound confirms intrauterine pregnancy approximately 6 weeks 2 days, heart rate of 126. Plan to followup with OB in 2 days for repeat Beta HCG Quant. Discussed lab results, imaging  results, and treatment plan with the patient. Return precautions given. Reports understanding and no other concerns at this time.  Patient is stable for discharge at this time.      Mellody DrownLauren Naliah Eddington, PA-C 07/21/14 1014

## 2014-07-20 NOTE — ED Notes (Signed)
Per pt sts positive pregnancy test a few weeks ago and now she is having vaginal spotting and cramping.

## 2014-07-20 NOTE — ED Notes (Signed)
Patient transported to Ultrasound 

## 2014-07-22 LAB — GC/CHLAMYDIA PROBE AMP
CT Probe RNA: NEGATIVE
GC Probe RNA: NEGATIVE

## 2014-07-29 NOTE — ED Provider Notes (Signed)
Medical screening examination/treatment/procedure(s) were performed by non-physician practitioner and as supervising physician I was immediately available for consultation/collaboration.   EKG Interpretation None        Stephaniemarie Stoffel N Ticia Virgo, DO 07/29/14 1352 

## 2014-09-05 ENCOUNTER — Ambulatory Visit (INDEPENDENT_AMBULATORY_CARE_PROVIDER_SITE_OTHER): Payer: Medicaid Other | Admitting: Obstetrics & Gynecology

## 2014-09-05 ENCOUNTER — Encounter: Payer: Self-pay | Admitting: Obstetrics & Gynecology

## 2014-09-05 VITALS — BP 108/72 | HR 64 | Temp 98.0°F | Wt 195.0 lb

## 2014-09-05 DIAGNOSIS — O09529 Supervision of elderly multigravida, unspecified trimester: Secondary | ICD-10-CM | POA: Insufficient documentation

## 2014-09-05 DIAGNOSIS — Z3490 Encounter for supervision of normal pregnancy, unspecified, unspecified trimester: Secondary | ICD-10-CM

## 2014-09-05 DIAGNOSIS — O09522 Supervision of elderly multigravida, second trimester: Secondary | ICD-10-CM

## 2014-09-05 DIAGNOSIS — J4520 Mild intermittent asthma, uncomplicated: Secondary | ICD-10-CM

## 2014-09-05 DIAGNOSIS — J45909 Unspecified asthma, uncomplicated: Secondary | ICD-10-CM | POA: Insufficient documentation

## 2014-09-05 DIAGNOSIS — E039 Hypothyroidism, unspecified: Secondary | ICD-10-CM | POA: Insufficient documentation

## 2014-09-05 DIAGNOSIS — E038 Other specified hypothyroidism: Secondary | ICD-10-CM

## 2014-09-05 NOTE — Progress Notes (Signed)
Subjective:    Linda Cortez is being seen today for her first obstetrical visit.  This is a planned pregnancy. She is at 4486w2d gestation. Her obstetrical history is significant for advanced maternal age. Relationship with FOB: spouse, living together. Patient does not intend to breast feed. Pregnancy history fully reviewed.  The information documented in the HPI was reviewed and verified.  Menstrual History: OB History    Gravida Para Term Preterm AB TAB SAB Ectopic Multiple Living   4 3 3       1        Patient's last menstrual period was 06/04/2014 (approximate).    Past Medical History  Diagnosis Date  . Thyroid disease   . Glaucoma     Past Surgical History  Procedure Laterality Date  . Pilonial cyst removed     . Breast surgery      Cyst removal      (Not in a hospital admission) Allergies  Allergen Reactions  . Augmentin [Amoxicillin-Pot Clavulanate] Rash  . Penicillins Rash    History  Substance Use Topics  . Smoking status: Former Games developermoker  . Smokeless tobacco: Not on file  . Alcohol Use: No    Family History  Problem Relation Age of Onset  . Cancer Mother      Review of Systems Constitutional: negative for weight loss Gastrointestinal: negative for vomiting Genitourinary:negative for genital lesions and vaginal discharge and dysuria Musculoskeletal:negative for back pain Behavioral/Psych: negative for abusive relationship, depression, illegal drug usage and tobacco use    Objective:    BP 108/72 mmHg  Pulse 64  Temp(Src) 98 F (36.7 C)  Wt 88.451 kg (195 lb)  LMP 06/04/2014 (Approximate) General Appearance:    Alert, cooperative, no distress, appears stated age  Head:    Normocephalic, without obvious abnormality, atraumatic  Eyes:    PERRL, conjunctiva/corneas clear, EOM's intact, fundi    benign, both eyes  Ears:    Normal TM's and external ear canals, both ears  Nose:   Nares normal, septum midline, mucosa normal, no drainage    or sinus  tenderness  Throat:   Lips, mucosa, and tongue normal; teeth and gums normal  Neck:   Supple, symmetrical, trachea midline, no adenopathy;    thyroid:  no enlargement/tenderness/nodules; no carotid   bruit or JVD  Back:     Symmetric, no curvature, ROM normal, no CVA tenderness  Lungs:     Clear to auscultation bilaterally, respirations unlabored  Chest Wall:    No tenderness or deformity   Heart:    Regular rate and rhythm, S1 and S2 normal, no murmur, rub   or gallop  Breast Exam:    No tenderness, masses, or nipple abnormality  Abdomen:     Soft, non-tender, bowel sounds active all four quadrants,    no masses, no organomegaly  Genitalia:    Normal female without lesion, discharge or tenderness  Extremities:   Extremities normal, atraumatic, no cyanosis or edema  Pulses:   2+ and symmetric all extremities  Skin:   Skin color, texture, turgor normal, no rashes or lesions  Lymph nodes:   Cervical, supraclavicular, and axillary nodes normal  Neurologic:   CNII-XII intact, normal strength, sensation and reflexes    throughout      Lab Review Urine pregnancy test Labs reviewed no Radiologic studies reviewed yes Assessment:    Pregnancy at 986w2d weeks    Plan:      Prenatal vitamins.  Counseling provided regarding continued use  of seat belts, cessation of alcohol consumption, smoking or use of illicit drugs; infection precautions i.e., influenza/TDAP immunizations, toxoplasmosis,CMV, parvovirus, listeria and varicella; workplace safety, exercise during pregnancy; routine dental care, safe medications, sexual activity, hot tubs, saunas, pools, travel, caffeine use, fish and methlymercury, potential toxins, hair treatments, varicose veins Weight gain recommendations per IOM guidelines reviewed: obese/BMI >30->gain  11 - 20 lbs Problem list reviewed and updated. CF mutation testing/NIPT/QUAD SCREEN/fragile XSpinal muscular atrophy discussed Role of ultrasound in pregnancy  discussed Amniocentesis discussed Orders Placed This Encounter  Procedures  . Culture, OB Urine  . Obstetric panel  . HIV antibody  . Hemoglobinopathy evaluation  . Varicella zoster antibody, IgG  . Vit D  25 hydroxy (rtn osteoporosis monitoring)  . TSH  . T4, free  . POCT urinalysis dipstick    Follow up in 4 weeks.

## 2014-09-06 LAB — OBSTETRIC PANEL
ANTIBODY SCREEN: NEGATIVE
BASOS PCT: 0 % (ref 0–1)
Basophils Absolute: 0 10*3/uL (ref 0.0–0.1)
EOS PCT: 2 % (ref 0–5)
Eosinophils Absolute: 0.2 10*3/uL (ref 0.0–0.7)
HEMATOCRIT: 35 % — AB (ref 36.0–46.0)
HEMOGLOBIN: 11.8 g/dL — AB (ref 12.0–15.0)
Hepatitis B Surface Ag: NEGATIVE
Lymphocytes Relative: 28 % (ref 12–46)
Lymphs Abs: 2.7 10*3/uL (ref 0.7–4.0)
MCH: 29.1 pg (ref 26.0–34.0)
MCHC: 33.7 g/dL (ref 30.0–36.0)
MCV: 86.2 fL (ref 78.0–100.0)
MONO ABS: 0.5 10*3/uL (ref 0.1–1.0)
MONOS PCT: 5 % (ref 3–12)
MPV: 11 fL (ref 9.4–12.4)
NEUTROS ABS: 6.2 10*3/uL (ref 1.7–7.7)
Neutrophils Relative %: 65 % (ref 43–77)
PLATELETS: 167 10*3/uL (ref 150–400)
RBC: 4.06 MIL/uL (ref 3.87–5.11)
RDW: 13.8 % (ref 11.5–15.5)
RUBELLA: 2.07 {index} — AB (ref <0.90)
Rh Type: POSITIVE
WBC: 9.6 10*3/uL (ref 4.0–10.5)

## 2014-09-06 LAB — TSH: TSH: 2.788 u[IU]/mL (ref 0.350–4.500)

## 2014-09-06 LAB — T4, FREE: Free T4: 1.31 ng/dL (ref 0.80–1.80)

## 2014-09-06 LAB — VARICELLA ZOSTER ANTIBODY, IGG: VARICELLA IGG: 207.7 {index} — AB (ref <135.00)

## 2014-09-06 LAB — HIV ANTIBODY (ROUTINE TESTING W REFLEX): HIV 1&2 Ab, 4th Generation: NONREACTIVE

## 2014-09-06 LAB — VITAMIN D 25 HYDROXY (VIT D DEFICIENCY, FRACTURES): Vit D, 25-Hydroxy: 15 ng/mL — ABNORMAL LOW (ref 30–100)

## 2014-09-07 LAB — CULTURE, OB URINE: Colony Count: 3000

## 2014-09-07 NOTE — Patient Instructions (Signed)
Prenatal Care  WHAT IS PRENATAL CARE?  Prenatal care means health care during your pregnancy, before your baby is born. It is very important to take care of yourself and your baby during your pregnancy by:   Getting early prenatal care. If you know you are pregnant, or think you might be pregnant, call your health care provider as soon as possible. Schedule a visit for a prenatal exam.  Getting regular prenatal care. Follow your health care provider's schedule for blood and other necessary tests. Do not miss appointments.  Doing everything you can to keep yourself and your baby healthy during your pregnancy.  Getting complete care. Prenatal care should include evaluation of the medical, dietary, educational, psychological, and social needs of you and your significant other. The medical and genetic history of your family and the family of your baby's father should be discussed with your health care provider.  Discussing with your health care provider:  Prescription, over-the-counter, and herbal medicines that you take.  Any history of substance abuse, alcohol use, smoking, and illegal drug use.  Any history of domestic abuse and violence.  Immunizations you have received.  Your nutrition and diet.  The amount of exercise you do.  Any environmental and occupational hazards to which you are exposed.  History of sexually transmitted infections for both you and your partner.  Previous pregnancies you have had. WHY IS PRENATAL CARE SO IMPORTANT?  By regularly seeing your health care provider, you help ensure that problems can be identified early so that they can be treated as soon as possible. Other problems might be prevented. Many studies have shown that early and regular prenatal care is important for the health of mothers and their babies.  HOW CAN I TAKE CARE OF MYSELF WHILE I AM PREGNANT?  Here are ways to take care of yourself and your baby:   Start or continue taking your  multivitamin with 400 micrograms (mcg) of folic acid every day.  Get early and regular prenatal care. It is very important to see a health care provider during your pregnancy. Your health care provider will check at each visit to make sure that you and your baby are healthy. If there are any problems, action can be taken right away to help you and your baby.  Eat a healthy diet that includes:  Fruits.  Vegetables.  Foods low in saturated fat.  Whole grains.  Calcium-rich foods, such as milk, yogurt, and hard cheeses.  Drink 6-8 glasses of liquids a day.  Unless your health care provider tells you not to, try to be physically active for 30 minutes, most days of the week. If you are pressed for time, you can get your activity in through 10-minute segments, three times a day.  Do not smoke, drink alcohol, or use drugs. These can cause long-term damage to your baby. Talk with your health care provider about steps to take to stop smoking. Talk with a member of your faith community, a counselor, a trusted friend, or your health care provider if you are concerned about your alcohol or drug use.  Ask your health care provider before taking any medicine, even over-the-counter medicines. Some medicines are not safe to take during pregnancy.  Get plenty of rest and sleep.  Avoid hot tubs and saunas during pregnancy.  Do not have X-rays taken unless absolutely necessary and with the recommendation of your health care provider. A lead shield can be placed on your abdomen to protect your baby when   X-rays are taken in other parts of your body.  Do not empty the cat litter when you are pregnant. It may contain a parasite that causes an infection called toxoplasmosis, which can cause birth defects. Also, use gloves when working in garden areas used by cats.  Do not eat uncooked or undercooked meats or fish.  Do not eat soft, mold-ripened cheeses (Brie, Camembert, and chevre) or soft, blue-veined  cheese (Danish blue and Roquefort).  Stay away from toxic chemicals like:  Insecticides.  Solvents (some cleaners or paint thinners).  Lead.  Mercury.  Sexual intercourse may continue until the end of the pregnancy, unless you have a medical problem or there is a problem with the pregnancy and your health care provider tells you not to.  Do not wear high-heel shoes, especially during the second half of the pregnancy. You can lose your balance and fall.  Do not take long trips, unless absolutely necessary. Be sure to see your health care provider before going on the trip.  Do not sit in one position for more than 2 hours when on a trip.  Take a copy of your medical records when going on a trip. Know where a hospital is located in the city you are visiting, in case of an emergency.  Most dangerous household products will have pregnancy warnings on their labels. Ask your health care provider about products if you are unsure.  Limit or eliminate your caffeine intake from coffee, tea, sodas, medicines, and chocolate.  Many women continue working through pregnancy. Staying active might help you stay healthier. If you have a question about the safety or the hours you work at your particular job, talk with your health care provider.  Get informed:  Read books.  Watch videos.  Go to childbirth classes for you and your significant other.  Talk with experienced moms.  Ask your health care provider about childbirth education classes for you and your partner. Classes can help you and your partner prepare for the birth of your baby.  Ask about a baby doctor (pediatrician) and methods and pain medicine for labor, delivery, and possible cesarean delivery. HOW OFTEN SHOULD I SEE MY HEALTH CARE PROVIDER DURING PREGNANCY?  Your health care provider will give you a schedule for your prenatal visits. You will have visits more often as you get closer to the end of your pregnancy. An average  pregnancy lasts about 40 weeks.  A typical schedule includes visiting your health care provider:   About once each month during your first 6 months of pregnancy.  Every 2 weeks during the next 2 months.  Weekly in the last month, until the delivery date. Your health care provider will probably want to see you more often if:  You are older than 35 years.  Your pregnancy is high risk because you have certain health problems or problems with the pregnancy, such as:  Diabetes.  High blood pressure.  The baby is not growing on schedule, according to the dates of the pregnancy. Your health care provider will do special tests to make sure you and your baby are not having any serious problems. WHAT HAPPENS DURING PRENATAL VISITS?   At your first prenatal visit, your health care provider will do a physical exam and talk to you about your health history and the health history of your partner and your family. Your health care provider will be able to tell you what date to expect your baby to be born on.  Your   first physical exam will include checks of your blood pressure, measurements of your height and weight, and an exam of your pelvic organs. Your health care provider will do a Pap test if you have not had one recently and will do cultures of your cervix to make sure there is no infection.  At each prenatal visit, there will be tests of your blood, urine, blood pressure, weight, and the progress of the baby will be checked.  At your later prenatal visits, your health care provider will check how you are doing and how your baby is developing. You may have a number of tests done as your pregnancy progresses.  Ultrasound exams are often used to check on your baby's growth and health.  You may have more urine and blood tests, as well as special tests, if needed. These may include amniocentesis to examine fluid in the pregnancy sac, stress tests to check how the baby responds to contractions, or a  biophysical profile to measure your baby's well-being. Your health care provider will explain the tests and why they are necessary.  You should be tested for high blood sugar (gestational diabetes) between the 24th and 28th weeks of your pregnancy.  You should discuss with your health care provider your plans to breastfeed or bottle-feed your baby.  Each visit is also a chance for you to learn about staying healthy during pregnancy and to ask questions. Document Released: 09/23/2003 Document Revised: 09/25/2013 Document Reviewed: 12/05/2013 Exit[employer] Patient Information 2015 [employer]. This information is not intended to replace advice given to you by your health care provider. Make sure you discuss any questions you have with your health care provider.  

## 2014-09-09 LAB — PAP IG, CT-NG NAA, HPV HIGH-RISK
Chlamydia Probe Amp: NEGATIVE
GC PROBE AMP: NEGATIVE
HPV DNA High Risk: NOT DETECTED

## 2014-09-09 LAB — HEMOGLOBINOPATHY EVALUATION
HGB A2 QUANT: 2.5 % (ref 2.2–3.2)
HGB A: 97.5 % (ref 96.8–97.8)
Hemoglobin Other: 0 %
Hgb F Quant: 0 % (ref 0.0–2.0)
Hgb S Quant: 0 %

## 2014-09-10 ENCOUNTER — Other Ambulatory Visit: Payer: Self-pay | Admitting: *Deleted

## 2014-09-10 ENCOUNTER — Encounter: Payer: Self-pay | Admitting: Obstetrics & Gynecology

## 2014-09-10 DIAGNOSIS — E559 Vitamin D deficiency, unspecified: Secondary | ICD-10-CM | POA: Insufficient documentation

## 2014-09-10 MED ORDER — OB COMPLETE PETITE 35-5-1-200 MG PO CAPS: 1.0000 | ORAL_CAPSULE | Freq: Every day | ORAL | Status: DC

## 2014-09-10 NOTE — Progress Notes (Signed)
Pt called regarding lab results.  Pt made aware of recommendations.  Prenatal vitamin sent to pharmacy.

## 2014-09-10 NOTE — Progress Notes (Signed)
Quick Note:  Needs PNV w/vitamin D ______ 

## 2014-09-17 ENCOUNTER — Encounter: Payer: Self-pay | Admitting: Obstetrics

## 2014-09-30 ENCOUNTER — Encounter: Payer: Self-pay | Admitting: *Deleted

## 2014-10-01 ENCOUNTER — Encounter: Payer: Self-pay | Admitting: Obstetrics & Gynecology

## 2014-10-02 ENCOUNTER — Encounter: Payer: Self-pay | Admitting: Obstetrics

## 2014-10-02 ENCOUNTER — Ambulatory Visit (INDEPENDENT_AMBULATORY_CARE_PROVIDER_SITE_OTHER): Payer: Medicaid Other | Admitting: Obstetrics

## 2014-10-02 VITALS — BP 99/60 | HR 69 | Wt 190.0 lb

## 2014-10-02 DIAGNOSIS — R112 Nausea with vomiting, unspecified: Secondary | ICD-10-CM

## 2014-10-02 DIAGNOSIS — IMO0002 Reserved for concepts with insufficient information to code with codable children: Secondary | ICD-10-CM

## 2014-10-02 DIAGNOSIS — O09522 Supervision of elderly multigravida, second trimester: Secondary | ICD-10-CM

## 2014-10-02 DIAGNOSIS — Z0489 Encounter for examination and observation for other specified reasons: Secondary | ICD-10-CM

## 2014-10-02 DIAGNOSIS — B3731 Acute candidiasis of vulva and vagina: Secondary | ICD-10-CM

## 2014-10-02 DIAGNOSIS — Z36 Encounter for antenatal screening of mother: Secondary | ICD-10-CM

## 2014-10-02 DIAGNOSIS — B373 Candidiasis of vulva and vagina: Secondary | ICD-10-CM

## 2014-10-02 LAB — POCT URINALYSIS DIPSTICK
BILIRUBIN UA: NEGATIVE
Blood, UA: 50
Glucose, UA: NEGATIVE
KETONES UA: NEGATIVE
NITRITE UA: NEGATIVE
PH UA: 6
Protein, UA: NEGATIVE
Spec Grav, UA: 1.02
Urobilinogen, UA: NEGATIVE

## 2014-10-02 MED ORDER — ONDANSETRON 4 MG PO TBDP: 4.0000 mg | ORAL_TABLET | Freq: Three times a day (TID) | ORAL | Status: DC | PRN

## 2014-10-02 MED ORDER — FLUCONAZOLE 150 MG PO TABS: 150.0000 mg | ORAL_TABLET | Freq: Once | ORAL | Status: DC

## 2014-10-03 ENCOUNTER — Encounter: Payer: Self-pay | Admitting: Obstetrics

## 2014-10-03 NOTE — Progress Notes (Signed)
  Subjective:    Linda Cortez is a 33 y.o. female being seen today for her obstetrical visit. She is at 9069w2d gestation. Patient reports: nausea and vomiting.  Problem List Items Addressed This Visit    Elderly multigravida - Primary   Relevant Orders      AMB Referral to Maternal Fetal Medicine (MFM)    Other Visit Diagnoses    Candida vaginitis        Relevant Medications       fluconazole (DIFLUCAN) tablet 150 mg    Nausea and vomiting, vomiting of unspecified type        Relevant Medications       ondansetron (ZOFRAN-ODT) disintegrating tablet    Evaluate anatomy not seen on prior sonogram        Relevant Orders       US OB Comp + 14 Wk      Patient Active Problem List   Diagnosis Date Noted  . Vitamin D deficiency 09/10/2014  . Elderly multigravida 09/05/2014  . Asthma 09/05/2014  . Hypothyroidism 09/05/2014    Objective:     BP 99/60 mmHg  Pulse 69  Wt 190 lb (86.183 kg)  LMP 06/04/2014 (Approximate) Uterine Size: Below umbilicus     Assessment:    Pregnancy @ 6769w2d  weeks Doing well    Plan:    Problem list reviewed and updated. Labs reviewed.  Follow up in 4 weeks. FIRST/CF mutation testing/NIPT/QUAD SCREEN/fragile X/Ashkenazi Jewish population testing/Spinal muscular atrophy discussed: requested. Role of ultrasound in pregnancy discussed; fetal survey: requested. Amniocentesis discussed: Genetic Counseling pending.

## 2014-10-04 NOTE — L&D Delivery Note (Addendum)
Delivery Note At 7:39 PM a viable female was delivered via Vaginal, Spontaneous Delivery (Presentation: Middle Occiput Anterior).  APGAR: 8, 9; weight: 3565 grams  .   Placenta status: spontaneous, shultz.  Cord:  with the following complications: None.  Cord pH: not done.    This is a 34 year old G 5 P3 who was admitted for Not in labor.. She progressed normally with one dose of Cytotec and low dose pitocin, had epidural one hour prior to the second stage of labor.  She pushed for 25 min.  She delivered a viable infant female, cephalic, over an intact perineum.  A nuchal cord  was not identified.  Infant was placed on maternal abdomen.  Delayed cord clamping done for about 2.5 minutes. Cord double clamped and cut.  Apgar scores were 8 and 9. The placenta delivered spontaneously, shultz, with a 3 vessel cord.  Inspection revealed 1st degree and left labial. The uterus was firm bleeding stable.  EBL was 200.    Placenta and umbilical artery blood gas were not sent.  There were no complications during the procedure.  Mom and baby skin to skin following delivery. Left in stable condition.    Anesthesia: Epidural  Episiotomy: None Lacerations:  1st degree right labial Suture Repair: none Est. Blood Loss (mL):  200  Mom to postpartum.  Baby to Couplet care / Skin to Skin.  Roe Coombsachelle A Denney, CNM 03/04/2015, 8:02 PM

## 2014-10-08 ENCOUNTER — Other Ambulatory Visit (HOSPITAL_COMMUNITY): Payer: Self-pay | Admitting: Obstetrics and Gynecology

## 2014-10-08 ENCOUNTER — Ambulatory Visit (HOSPITAL_COMMUNITY)
Admission: RE | Admit: 2014-10-08 | Discharge: 2014-10-08 | Disposition: A | Discharge status: Home / Self Care | Payer: Medicaid Other | Source: Ambulatory Visit | Attending: Obstetrics | Admitting: Obstetrics

## 2014-10-08 ENCOUNTER — Encounter (HOSPITAL_COMMUNITY): Payer: Self-pay

## 2014-10-08 ENCOUNTER — Other Ambulatory Visit: Payer: Self-pay | Admitting: Obstetrics

## 2014-10-08 DIAGNOSIS — IMO0002 Reserved for concepts with insufficient information to code with codable children: Secondary | ICD-10-CM

## 2014-10-08 DIAGNOSIS — O09522 Supervision of elderly multigravida, second trimester: Secondary | ICD-10-CM | POA: Insufficient documentation

## 2014-10-08 DIAGNOSIS — Z315 Encounter for genetic counseling: Secondary | ICD-10-CM | POA: Diagnosis present

## 2014-10-08 DIAGNOSIS — O09529 Supervision of elderly multigravida, unspecified trimester: Secondary | ICD-10-CM | POA: Insufficient documentation

## 2014-10-08 DIAGNOSIS — Z0489 Encounter for examination and observation for other specified reasons: Secondary | ICD-10-CM

## 2014-10-08 DIAGNOSIS — O99282 Endocrine, nutritional and metabolic diseases complicating pregnancy, second trimester: Secondary | ICD-10-CM | POA: Diagnosis not present

## 2014-10-08 DIAGNOSIS — E039 Hypothyroidism, unspecified: Secondary | ICD-10-CM | POA: Insufficient documentation

## 2014-10-08 DIAGNOSIS — Z3A18 18 weeks gestation of pregnancy: Secondary | ICD-10-CM | POA: Diagnosis not present

## 2014-10-08 DIAGNOSIS — Z3689 Encounter for other specified antenatal screening: Secondary | ICD-10-CM | POA: Insufficient documentation

## 2014-10-08 DIAGNOSIS — O99280 Endocrine, nutritional and metabolic diseases complicating pregnancy, unspecified trimester: Secondary | ICD-10-CM

## 2014-10-08 NOTE — Progress Notes (Signed)
Genetic Counseling  High-Risk Gestation Note  Appointment Date:  10/08/2014 Referred By: Brock BadHarper, Charles A, MD Date of Birth:  03/21/1981 Partner:  Linda Cannerashi Askin   Pregnancy History: [id number]: G5P3011 Estimated Date of Delivery: 03/11/15 Estimated Gestational Age: 6955w0d Attending: Damaris HippoJeffrey Denney, MD   [name] and her husband, [name], were seen for genetic counseling because of a maternal age of 34 y.o.Marland Kitchen.     They were counseled regarding maternal age and the association with risk for chromosome conditions due to nondisjunction with aging of the ova.   We reviewed chromosomes, nondisjunction, and the associated 1 in 141 risk for fetal aneuploidy related to a maternal age of 34 y.o. at 9155w0d gestation.  They were counseled that the risk for aneuploidy decreases as gestational age increases, accounting for those pregnancies which spontaneously abort.  We specifically discussed Down syndrome (trisomy 121), trisomies 5313 and 5318, and sex chromosome aneuploidies (47,XXX and 47,XXY) including the common features and prognoses of each.   We reviewed available screening options including Quad screen, noninvasive prenatal screening (NIPS)/cell free DNA (cfDNA) testing, and detailed ultrasound.  They were counseled that screening tests are used to modify a patient's a priori risk for aneuploidy, typically based on age. This estimate provides a pregnancy specific risk assessment. We reviewed the benefits and limitations of each option. Specifically, we discussed the conditions for which each test screens, the detection rates, and false positive rates of each. They were also counseled regarding diagnostic testing via amniocentesis. We reviewed the approximate 1 in 300-500 risk for complications for amniocentesis, including spontaneous pregnancy loss.   A detailed ultrasound was performed today. The ultrasound report will be sent under separate cover. There were no visualized fetal anomalies or markers  suggestive of aneuploidy. Follow-up ultrasound was planned for 11/19/14.  After careful consideration, Linda Cortez declined additional screening and testing for fetal aneuploidy including Quad screen, NIPS, and amniocentesis. She understands that screening tests cannot rule out all birth defects or genetic syndromes. The patient was advised of this limitation and states she still does not want additional testing at this time.   Linda Cortez was provided with written information regarding cystic fibrosis (CF) including the carrier frequency and incidence in the Caucasian and African American populations, the availability of carrier testing and prenatal diagnosis if indicated.  In addition, we discussed that CF is routinely screened for as part of the [place] newborn screening panel.  She declined testing today.   Both family histories were reviewed and found to be noncontributory for birth defects, intellectual disability, and known genetic conditions. Without further information regarding the provided family history, an accurate genetic risk cannot be calculated. Further genetic counseling is warranted if more information is obtained.  Linda Cortez denied exposure to environmental toxins or chemical agents. She denied the use of alcohol, tobacco or street drugs. She denied significant viral illnesses during the course of her pregnancy. Her medical and surgical histories were contributory for hypothyroidism for which she is taking levothyroxine.   I counseled this couple regarding the above risks and available options.  The approximate face-to-face time with the genetic counselor was 35 minutes.  Quinn PlowmanKaren Kavan Devan, MS,  Certified Genetic Counselor 10/08/2014

## 2014-10-30 ENCOUNTER — Encounter: Payer: Medicaid Other | Admitting: Obstetrics

## 2014-11-01 ENCOUNTER — Encounter: Payer: Self-pay | Admitting: Obstetrics

## 2014-11-01 ENCOUNTER — Ambulatory Visit (INDEPENDENT_AMBULATORY_CARE_PROVIDER_SITE_OTHER): Payer: Medicaid Other | Admitting: Obstetrics

## 2014-11-01 VITALS — BP 101/69 | HR 69 | Temp 97.4°F | Wt 207.0 lb

## 2014-11-01 DIAGNOSIS — L0293 Carbuncle, unspecified: Secondary | ICD-10-CM

## 2014-11-01 DIAGNOSIS — B373 Candidiasis of vulva and vagina: Secondary | ICD-10-CM

## 2014-11-01 DIAGNOSIS — B3731 Acute candidiasis of vulva and vagina: Secondary | ICD-10-CM

## 2014-11-01 DIAGNOSIS — Z3482 Encounter for supervision of other normal pregnancy, second trimester: Secondary | ICD-10-CM

## 2014-11-01 LAB — POCT URINALYSIS DIPSTICK
Bilirubin, UA: NEGATIVE
Blood, UA: NEGATIVE
GLUCOSE UA: NEGATIVE
KETONES UA: NEGATIVE
LEUKOCYTES UA: NEGATIVE
NITRITE UA: NEGATIVE
PH UA: 5
PROTEIN UA: NEGATIVE
Spec Grav, UA: 1.02
UROBILINOGEN UA: NEGATIVE

## 2014-11-01 MED ORDER — CLINDAMYCIN HCL 300 MG PO CAPS: 300.0000 mg | ORAL_CAPSULE | Freq: Three times a day (TID) | ORAL | Status: DC

## 2014-11-01 MED ORDER — FLUCONAZOLE 150 MG PO TABS: 150.0000 mg | ORAL_TABLET | Freq: Once | ORAL | Status: DC

## 2014-11-01 NOTE — Progress Notes (Signed)
Subjective:    Linda Cortez is a 34 y.o. female being seen today for her obstetrical visit. She is at 5251w3d gestation. Patient reports: no complaints . Fetal movement: normal.  Problem List Items Addressed This Visit    None    Visit Diagnoses    Encounter for supervision of other normal pregnancy in second trimester    -  Primary    Relevant Orders    POCT urinalysis dipstick (Completed)    Recurrent boils        Relevant Medications    clindamycin (CLEOCIN) capsule    fluconazole (DIFLUCAN) tablet 150 mg    Candida vaginitis        Relevant Medications    clindamycin (CLEOCIN) capsule    fluconazole (DIFLUCAN) tablet 150 mg      Patient Active Problem List   Diagnosis Date Noted  . Encounter for fetal anatomic survey   . Advanced maternal age in multigravida   . [redacted] weeks gestation of pregnancy   . Hypothyroid in pregnancy, antepartum   . Vitamin D deficiency 09/10/2014  . Elderly multigravida 09/05/2014  . Asthma 09/05/2014  . Hypothyroidism 09/05/2014   Objective:    BP 101/69 mmHg  Pulse 69  Temp(Src) 97.4 F (36.3 C)  Wt 207 lb (93.895 kg)  LMP 06/04/2014 (Approximate) FHT: 160 BPM  Uterine Size: size equals dates     Assessment:    Pregnancy @ 1051w3d    Plan:    OBGCT: ordered for next visit.  Labs, problem list reviewed and updated 2 hr GTT planned Follow up in 4 weeks.

## 2014-11-13 ENCOUNTER — Telehealth: Payer: Self-pay | Admitting: Obstetrics

## 2014-11-13 ENCOUNTER — Inpatient Hospital Stay (HOSPITAL_COMMUNITY)
Admission: AD | Admit: 2014-11-13 | Discharge: 2014-11-13 | Disposition: A | Discharge status: Home / Self Care | Payer: Medicaid Other | Source: Ambulatory Visit | Attending: Obstetrics | Admitting: Obstetrics

## 2014-11-13 ENCOUNTER — Encounter (HOSPITAL_COMMUNITY): Payer: Self-pay | Admitting: *Deleted

## 2014-11-13 DIAGNOSIS — O99342 Other mental disorders complicating pregnancy, second trimester: Secondary | ICD-10-CM

## 2014-11-13 DIAGNOSIS — R001 Bradycardia, unspecified: Secondary | ICD-10-CM | POA: Diagnosis not present

## 2014-11-13 DIAGNOSIS — Z3A23 23 weeks gestation of pregnancy: Secondary | ICD-10-CM | POA: Diagnosis not present

## 2014-11-13 DIAGNOSIS — R002 Palpitations: Secondary | ICD-10-CM | POA: Diagnosis not present

## 2014-11-13 DIAGNOSIS — F419 Anxiety disorder, unspecified: Secondary | ICD-10-CM | POA: Diagnosis not present

## 2014-11-13 DIAGNOSIS — Z87891 Personal history of nicotine dependence: Secondary | ICD-10-CM | POA: Insufficient documentation

## 2014-11-13 DIAGNOSIS — O9989 Other specified diseases and conditions complicating pregnancy, childbirth and the puerperium: Secondary | ICD-10-CM | POA: Insufficient documentation

## 2014-11-13 DIAGNOSIS — Z3A24 24 weeks gestation of pregnancy: Secondary | ICD-10-CM

## 2014-11-13 HISTORY — DX: Anxiety disorder, unspecified: F41.9

## 2014-11-13 MED ORDER — SERTRALINE HCL 50 MG PO TABS: 50.0000 mg | ORAL_TABLET | Freq: Every day | ORAL | Status: DC

## 2014-11-13 MED ORDER — HYDROXYZINE PAMOATE 25 MG PO CAPS: 25.0000 mg | ORAL_CAPSULE | Freq: Three times a day (TID) | ORAL | Status: DC | PRN
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
## 2014-11-13 NOTE — MAU Note (Signed)
Dx'd with heart murmur last week by PCP, hasn't "felt right" for 2 days.  Today is dizzy, lightheaded, L arm is weak & hurts, is SOB.  Feels like heart is racing, denies chest pain.

## 2014-11-13 NOTE — MAU Note (Signed)
Pt states she had previously been on anxiety medication but stopped as soon as she found out she was pregnant.

## 2014-11-13 NOTE — Telephone Encounter (Signed)
Pt called to requested appt to come to see Dr.Harper. Pt stated she went to her pcp office and pcp let pt know  that she  has a heart murmurs.  When pt called Dr. Clearance CootsHarper office stated she doesn't feel good pt feel a lot of weakness and dizz. I [facility]. Pt sound like  she understood office recomendation.   Marines Mattapoisett CenterJackson

## 2014-11-13 NOTE — Discharge Instructions (Signed)
Bradycardia °Bradycardia is a term for a heart rate (pulse) that, in adults, is slower than 60 beats per minute. A normal rate is 60 to 100 beats per minute. A heart rate below 60 beats per minute may be normal for some adults with healthy hearts. If the rate is too slow, the heart may have trouble pumping the volume of blood the body needs. If the heart rate gets too low, blood flow to the brain may be decreased and may make you feel lightheaded, dizzy, or faint. °The heart has a natural pacemaker in the top of the heart called the SA node (sinoatrial or sinus node). This pacemaker sends out regular electrical signals to the muscle of the heart, telling the heart muscle when to beat (contract). The electrical signal travels from the upper parts of the heart (atria) through the AV node (atrioventricular node), to the lower chambers of the heart (ventricles). The ventricles squeeze, pumping the blood from your heart to your lungs and to the rest of your body. °CAUSES  °· Problem with the heart's electrical system. °· Problem with the heart's natural pacemaker. °· Heart disease, damage, or infection. °· Medications. °· Problems with minerals and salts (electrolytes). °SYMPTOMS  °· Fainting (syncope). °· Fatigue and weakness. °· Shortness of breath (dyspnea). °· Chest pain (angina). °· Drowsiness. °· Confusion. °DIAGNOSIS  °· An electrocardiogram (ECG) can help your caregiver determine the type of slow heart rate you have. °· If the cause is not seen on an ECG, you may need to wear a heart monitor that records your heart rhythm for several hours or days. °· Blood tests. °TREATMENT  °· Electrolyte supplements. °· Medications. °· Withholding medication which is causing a slow heart rate. °· Pacemaker placement. °SEEK IMMEDIATE MEDICAL CARE IF:  °· You feel lightheaded or faint. °· You develop an irregular heart rate. °· You feel chest pain or have trouble breathing. °MAKE SURE YOU:  °· Understand these  instructions. °· Will watch your condition. °· Will get help right away if you are not doing well or get worse. °Document Released: 06/12/2002 Document Revised: 12/13/2011 Document Reviewed: 12/26/2013 °ExitCare® Patient Information ©2015 [employer]. This information is not intended to replace advice given to you by your health care provider. Make sure you discuss any questions you have with your health care provider. ° °

## 2014-11-14 ENCOUNTER — Encounter: Payer: Self-pay | Admitting: *Deleted

## 2014-11-14 ENCOUNTER — Telehealth: Payer: Self-pay | Admitting: *Deleted

## 2014-11-14 NOTE — Telephone Encounter (Signed)
Patient called and states [facility] and diagnosed with Bradycardia. She feels weak and dizzy and feels it is unsafe for her to be driving long distance at this time. A referral has been made to cardiologist. Dr Clearance CootsHarper agrees that she should not be driving long distance at this time and will write a letter stating that.

## 2014-11-19 ENCOUNTER — Encounter (HOSPITAL_COMMUNITY): Payer: Self-pay

## 2014-11-19 ENCOUNTER — Other Ambulatory Visit: Payer: Self-pay | Admitting: *Deleted

## 2014-11-19 ENCOUNTER — Telehealth: Payer: Self-pay

## 2014-11-19 ENCOUNTER — Ambulatory Visit (HOSPITAL_COMMUNITY)
Admission: RE | Admit: 2014-11-19 | Discharge: 2014-11-19 | Disposition: A | Discharge status: Home / Self Care | Payer: Medicaid Other | Source: Ambulatory Visit | Attending: Obstetrics | Admitting: Obstetrics

## 2014-11-19 DIAGNOSIS — R001 Bradycardia, unspecified: Secondary | ICD-10-CM

## 2014-11-19 DIAGNOSIS — O09522 Supervision of elderly multigravida, second trimester: Secondary | ICD-10-CM | POA: Diagnosis present

## 2014-11-19 DIAGNOSIS — Z3A24 24 weeks gestation of pregnancy: Secondary | ICD-10-CM | POA: Diagnosis not present

## 2014-11-19 DIAGNOSIS — O09529 Supervision of elderly multigravida, unspecified trimester: Secondary | ICD-10-CM | POA: Insufficient documentation

## 2014-11-19 DIAGNOSIS — Z0489 Encounter for examination and observation for other specified reasons: Secondary | ICD-10-CM | POA: Insufficient documentation

## 2014-11-19 DIAGNOSIS — IMO0002 Reserved for concepts with insufficient information to code with codable children: Secondary | ICD-10-CM | POA: Insufficient documentation

## 2014-11-19 DIAGNOSIS — Z36 Encounter for antenatal screening of mother: Secondary | ICD-10-CM | POA: Diagnosis not present

## 2014-11-19 HISTORY — DX: Bradycardia, unspecified: R00.1

## 2014-11-19 NOTE — Telephone Encounter (Signed)
[facility] REGARDING REFERRAL TO CARDIOLOGIST - THEY ARE TO CALL BACK AND LET ME KNOW STATUS OF SCHEDULE - MAY HAVE TO REPUT IN ORDER

## 2014-11-20 ENCOUNTER — Telehealth: Payer: Self-pay

## 2014-11-20 NOTE — Telephone Encounter (Signed)
PATIENT HAS APPT WITH CARDIOLOGIST ON 12/06/14 AT 3PM - LEFT MESSAGE AND THEIR PHONE NUMBER

## 2014-11-26 ENCOUNTER — Other Ambulatory Visit (HOSPITAL_COMMUNITY): Payer: Self-pay | Admitting: Maternal and Fetal Medicine

## 2014-11-26 DIAGNOSIS — E039 Hypothyroidism, unspecified: Secondary | ICD-10-CM

## 2014-11-26 DIAGNOSIS — O09523 Supervision of elderly multigravida, third trimester: Secondary | ICD-10-CM

## 2014-12-02 ENCOUNTER — Other Ambulatory Visit: Payer: Medicaid Other

## 2014-12-02 ENCOUNTER — Ambulatory Visit (INDEPENDENT_AMBULATORY_CARE_PROVIDER_SITE_OTHER): Payer: Medicaid Other | Admitting: Obstetrics

## 2014-12-02 ENCOUNTER — Encounter: Payer: Self-pay | Admitting: Obstetrics

## 2014-12-02 VITALS — BP 106/64 | HR 75 | Temp 97.5°F | Wt 212.0 lb

## 2014-12-02 DIAGNOSIS — Z3482 Encounter for supervision of other normal pregnancy, second trimester: Secondary | ICD-10-CM

## 2014-12-02 LAB — POCT URINALYSIS DIPSTICK
BILIRUBIN UA: NEGATIVE
Blood, UA: NEGATIVE
GLUCOSE UA: NEGATIVE
Ketones, UA: NEGATIVE
LEUKOCYTES UA: NEGATIVE
NITRITE UA: NEGATIVE
PH UA: 5
Protein, UA: NEGATIVE
Spec Grav, UA: 1.01
UROBILINOGEN UA: NEGATIVE

## 2014-12-02 LAB — CBC
HCT: 37 % (ref 36.0–46.0)
Hemoglobin: 12.2 g/dL (ref 12.0–15.0)
MCH: 29.6 pg (ref 26.0–34.0)
MCHC: 33 g/dL (ref 30.0–36.0)
MCV: 89.8 fL (ref 78.0–100.0)
MPV: 10.6 fL (ref 8.6–12.4)
Platelets: 167 10*3/uL (ref 150–400)
RBC: 4.12 MIL/uL (ref 3.87–5.11)
RDW: 13.5 % (ref 11.5–15.5)
WBC: 10.6 10*3/uL — ABNORMAL HIGH (ref 4.0–10.5)

## 2014-12-02 NOTE — Progress Notes (Signed)
Subjective:    Linda Cortez is a 34 y.o. female being seen today for her obstetrical visit. She is at 969w6d gestation. Patient reports no complaints. Fetal movement: normal.  Problem List Items Addressed This Visit    None    Visit Diagnoses    Encounter for supervision of other normal pregnancy in second trimester    -  Primary    Relevant Orders    POCT urinalysis dipstick (Completed)    Glucose Tolerance, 2 Hours w/1 Hour    CBC    HIV antibody    RPR      Patient Active Problem List   Diagnosis Date Noted  . [redacted] weeks gestation of pregnancy   . AMA (advanced maternal age) multigravida 35+   . Evaluate anatomy not seen on prior sonogram   . Encounter for fetal anatomic survey   . Advanced maternal age in multigravida   . [redacted] weeks gestation of pregnancy   . Hypothyroid in pregnancy, antepartum   . Vitamin D deficiency 09/10/2014  . Elderly multigravida 09/05/2014  . Asthma 09/05/2014  . Hypothyroidism 09/05/2014   Objective:    BP 106/64 mmHg  Pulse 75  Temp(Src) 97.5 F (36.4 C)  Wt 212 lb (96.163 kg)  LMP 06/04/2014 (Approximate) FHT:  160 BPM  Uterine Size: size equals dates  Presentation: unsure     Assessment:    Pregnancy @ 869w6d weeks   Plan:     labs reviewed, problem list updated Consent signed. GBS sent TDAP offered  Rhogam given for RH negative Pediatrician: discussed. Infant feeding: plans to breastfeed. Maternity leave: discussed. Cigarette smoking: former smoker. Orders Placed This Encounter  Procedures  . Glucose Tolerance, 2 Hours w/1 Hour  . CBC  . HIV antibody  . RPR  . POCT urinalysis dipstick   No orders of the defined types were placed in this encounter.   Follow up in 2 Weeks.

## 2014-12-03 LAB — GLUCOSE TOLERANCE, 2 HOURS W/ 1HR
GLUCOSE: 155 mg/dL (ref 70–170)
Glucose, 2 hour: 96 mg/dL (ref 70–139)
Glucose, Fasting: 76 mg/dL (ref 70–99)

## 2014-12-03 LAB — HIV ANTIBODY (ROUTINE TESTING W REFLEX): HIV: NONREACTIVE

## 2014-12-03 LAB — RPR

## 2014-12-06 ENCOUNTER — Encounter: Payer: Self-pay | Admitting: Internal Medicine

## 2014-12-06 ENCOUNTER — Ambulatory Visit (INDEPENDENT_AMBULATORY_CARE_PROVIDER_SITE_OTHER): Payer: Medicaid Other | Admitting: Internal Medicine

## 2014-12-06 ENCOUNTER — Institutional Professional Consult (permissible substitution): Payer: Medicaid Other | Admitting: Internal Medicine

## 2014-12-06 VITALS — BP 82/40 | HR 95 | Ht 63.0 in | Wt 213.1 lb

## 2014-12-06 DIAGNOSIS — R0602 Shortness of breath: Secondary | ICD-10-CM | POA: Diagnosis not present

## 2014-12-06 DIAGNOSIS — R002 Palpitations: Secondary | ICD-10-CM | POA: Diagnosis not present

## 2014-12-06 NOTE — Progress Notes (Signed)
Cardiology Office Note   Date:  12/06/2014   ID:  Linda Cortez, DOB 06/01/1981, MRN [id number]  PCP:  Pcp Not In System  Cardiologist:   Dietrich PatesPaula Keddrick Wyne, MD   Chief Complaint  Patient presents with  . Bradycardia    New Patient      History of Present Illness: Linda Cortez is a 34 y.o. female with a history of  patinet referred murmur and slow HR Heard she had a murmur 1 year ago  [place]  [place]  G4 P3  Currently [redacted] wks pregnant    Hospitalized when felt like going to die  Has been SOB  Different this time  Flipping around   Dizzy  No syncope  Arm/shoulder L started aching    No ultrasound of heart    Before pregnant was light headed and dizzy  No low HR   Pregnant felt she couldn't do things.       Current Outpatient Prescriptions  Medication Sig Dispense Refill  . levothyroxine (SYNTHROID, LEVOTHROID) 200 MCG tablet Take 200 mcg by mouth daily before breakfast.    . hydrOXYzine (VISTARIL) 25 MG capsule Take 1 capsule (25 mg total) by mouth 3 (three) times daily as needed for anxiety. (Patient not taking: Reported on 12/02/2014) 30 capsule 1  . sertraline (ZOLOFT) 50 MG tablet Take 1 tablet (50 mg total) by mouth daily. (Patient not taking: Reported on 12/02/2014) 30 tablet 1   No current facility-administered medications for this visit.    Allergies:   Augmentin and Penicillins   Past Medical History  Diagnosis Date  . Thyroid disease   . Glaucoma   . Anxiety   . Bradycardia     Past Surgical History  Procedure Laterality Date  . Pilonial cyst removed     . Breast surgery      Cyst removal      Social History:  The patient  reports that she has quit smoking. She does not have any smokeless tobacco history on file. She reports that she does not drink alcohol or use illicit drugs.   Family History:  The patient's family history includes Cancer in her mother.    ROS:  Please see the history of present illness. All  other systems are reviewed and  Negative to the above problem except as noted.    PHYSICAL EXAM: VS:  BP 82/40 mmHg  Pulse 95  Ht 5\' 3"  (1.6 m)  Wt 213 lb 1.9 oz (96.671 kg)  BMI 37.76 kg/m2  SpO2 97%  LMP 06/04/2014 (Approximate)   BP laying 96/54  P 72  Sitting  98/58  P 68  Standing 2 min 100/58  P 80  4 min 100/58  P 72    GEN: Well nourished, well developed, in no acute distress HEENT: normal Neck: no JVD, carotid bruits, or masses Cardiac: RRR; no murmurs, rubs, or gallops,no edema  Respiratory:  clear to auscultation bilaterally, normal work of breathing GI: soft, nontender, nondistended, + BS  No hepatomegaly  MS: no deformity Moving all extremities   Skin: warm and dry, no rash Neuro:  Strength and sensation are intact Psych: euthymic mood, full affect   EKG:  EKG is not ordered today.   Lipid Panel No results found for: CHOL, TRIG, HDL, CHOLHDL, VLDL, LDLCALC, LDLDIRECT    Wt Readings from Last 3 Encounters:  12/06/14 213 lb 1.9 oz (96.671 kg)  12/02/14 212 lb (96.163 kg)  11/01/14 207 lb (  93.895 kg)      ASSESSMENT AND PLAN:  Patient is a 34 yo who is now [redacted] wk pregnant  Episode of feeling heart flip  SOB  Dizzy. Since this visit has had dizziness, SOB  On exam BP is low end of normal.  I think she may get symptomatic from this at times   Otherwise exam is normla  I would set up for an echo to evaluate LV function  If normal I would recomm that patinet increase fluid and salt intake, take activites as toerated.     Current medicines are reviewed at length with the patient today.  The patient does not have concerns regarding medicines.  Signed, Dietrich Pates, MD  12/06/2014 3:36 PM    [facility] [address] [place], [place], [place]  [address] Phone: [phone]; Fax: [phone]

## 2014-12-06 NOTE — Patient Instructions (Signed)
Your physician recommends that you continue on your current medications as directed. Please refer to the Current Medication list given to you today.  Your physician has requested that you have an echocardiogram. Echocardiography is a painless test that uses sound waves to create images of your heart. It provides your doctor with information about the size and shape of your heart and how well your heart's chambers and valves are working. This procedure takes approximately one hour. There are no restrictions for this procedure.  Your physician recommends that you schedule a follow-up appointment as needed with Dr. Ross.   

## 2014-12-12 ENCOUNTER — Other Ambulatory Visit (HOSPITAL_COMMUNITY): Payer: Self-pay | Admitting: Radiology

## 2014-12-12 ENCOUNTER — Ambulatory Visit (HOSPITAL_COMMUNITY): Payer: Medicaid Other | Attending: Cardiology | Admitting: Radiology

## 2014-12-12 DIAGNOSIS — R0602 Shortness of breath: Secondary | ICD-10-CM | POA: Diagnosis not present

## 2014-12-12 DIAGNOSIS — Z87891 Personal history of nicotine dependence: Secondary | ICD-10-CM | POA: Diagnosis not present

## 2014-12-12 DIAGNOSIS — R002 Palpitations: Secondary | ICD-10-CM | POA: Insufficient documentation

## 2014-12-12 NOTE — Progress Notes (Signed)
Echocardiogram performed.  

## 2014-12-16 ENCOUNTER — Ambulatory Visit (INDEPENDENT_AMBULATORY_CARE_PROVIDER_SITE_OTHER): Payer: Medicaid Other | Admitting: Obstetrics

## 2014-12-16 ENCOUNTER — Encounter: Payer: Self-pay | Admitting: Obstetrics

## 2014-12-16 VITALS — BP 106/68 | HR 78 | Temp 98.2°F | Wt 215.0 lb

## 2014-12-16 DIAGNOSIS — Z3482 Encounter for supervision of other normal pregnancy, second trimester: Secondary | ICD-10-CM

## 2014-12-16 DIAGNOSIS — N390 Urinary tract infection, site not specified: Secondary | ICD-10-CM

## 2014-12-16 LAB — POCT URINALYSIS DIPSTICK
Bilirubin, UA: NEGATIVE
Glucose, UA: NEGATIVE
Ketones, UA: NEGATIVE
Nitrite, UA: NEGATIVE
PH UA: 6.5
Protein, UA: NEGATIVE
RBC UA: NEGATIVE
SPEC GRAV UA: 1.01
UROBILINOGEN UA: NEGATIVE

## 2014-12-16 MED ORDER — NITROFURANTOIN MONOHYD MACRO 100 MG PO CAPS: 100.0000 mg | ORAL_CAPSULE | Freq: Two times a day (BID) | ORAL | Status: DC

## 2014-12-16 NOTE — Progress Notes (Signed)
Subjective:    Linda Cortez is a 34 y.o. female being seen today for her obstetrical visit. She is at 7550w6d gestation. Patient reports: urinary frequency . Fetal movement: normal.  Problem List Items Addressed This Visit    None    Visit Diagnoses    Encounter for supervision of other normal pregnancy in second trimester    -  Primary    Relevant Orders    POCT urinalysis dipstick (Completed)    UTI (lower urinary tract infection)        Relevant Medications    Nitrofurantoin monohyd macro (MACROBID) 100 mg po cap      Patient Active Problem List   Diagnosis Date Noted  . [redacted] weeks gestation of pregnancy   . AMA (advanced maternal age) multigravida 35+   . Evaluate anatomy not seen on prior sonogram   . Encounter for fetal anatomic survey   . Advanced maternal age in multigravida   . [redacted] weeks gestation of pregnancy   . Hypothyroid in pregnancy, antepartum   . Vitamin D deficiency 09/10/2014  . Elderly multigravida 09/05/2014  . Asthma 09/05/2014  . Hypothyroidism 09/05/2014   Objective:    BP 106/68 mmHg  Pulse 78  Temp(Src) 98.2 F (36.8 C)  Wt 215 lb (97.523 kg)  LMP 06/04/2014 (Approximate) FHT: 160 BPM  Uterine Size: size equals dates     Assessment:    Pregnancy @ 7650w6d     Urinary frequency.   Plan:   Urine culture sent Macrobid Rx   OBGCT: discussed.  Labs, problem list reviewed and updated 2 hr GTT planned Follow up in 2 weeks.

## 2014-12-17 LAB — URINE CULTURE
COLONY COUNT: NO GROWTH
Organism ID, Bacteria: NO GROWTH

## 2014-12-31 ENCOUNTER — Encounter: Payer: Self-pay | Admitting: Obstetrics

## 2014-12-31 ENCOUNTER — Ambulatory Visit (INDEPENDENT_AMBULATORY_CARE_PROVIDER_SITE_OTHER): Payer: Medicaid Other | Admitting: Obstetrics

## 2014-12-31 VITALS — BP 100/62 | HR 78 | Temp 98.3°F | Wt 220.0 lb

## 2014-12-31 DIAGNOSIS — Z3483 Encounter for supervision of other normal pregnancy, third trimester: Secondary | ICD-10-CM

## 2014-12-31 LAB — POCT URINALYSIS DIPSTICK
Bilirubin, UA: NEGATIVE
Glucose, UA: NEGATIVE
Ketones, UA: NEGATIVE
Leukocytes, UA: NEGATIVE
NITRITE UA: NEGATIVE
Protein, UA: NEGATIVE
RBC UA: NEGATIVE
Spec Grav, UA: <=1.005
Urobilinogen, UA: NEGATIVE
pH, UA: 7

## 2014-12-31 NOTE — Progress Notes (Signed)
Subjective:    Linda Cortez is a 34 y.o. female being seen today for her obstetrical visit. She is at 4726w0d gestation. Patient reports no complaints. Fetal movement: normal.  Problem List Items Addressed This Visit    None    Visit Diagnoses    Encounter for supervision of other normal pregnancy in third trimester    -  Primary    Relevant Orders    POCT urinalysis dipstick (Completed)      Patient Active Problem List   Diagnosis Date Noted  . [redacted] weeks gestation of pregnancy   . AMA (advanced maternal age) multigravida 35+   . Evaluate anatomy not seen on prior sonogram   . Encounter for fetal anatomic survey   . Advanced maternal age in multigravida   . [redacted] weeks gestation of pregnancy   . Hypothyroid in pregnancy, antepartum   . Vitamin D deficiency 09/10/2014  . Elderly multigravida 09/05/2014  . Asthma 09/05/2014  . Hypothyroidism 09/05/2014   Objective:    BP 100/62 mmHg  Pulse 78  Temp(Src) 98.3 F (36.8 C)  Wt 220 lb (99.791 kg)  LMP 06/04/2014 (Approximate) FHT:  160 BPM  Uterine Size: size greater than dates  Presentation: unsure     Assessment:    Pregnancy @ 6626w0d weeks   Plan:     labs reviewed, problem list updated Consent signed. GBS sent TDAP offered  Rhogam given for RH negative Pediatrician: discussed. Infant feeding: plans to breastfeed. Maternity leave: discussed. Cigarette smoking: former smoker. Orders Placed This Encounter  Procedures  . POCT urinalysis dipstick   No orders of the defined types were placed in this encounter.   Follow up in 2 Weeks.

## 2015-01-14 ENCOUNTER — Ambulatory Visit (HOSPITAL_COMMUNITY)
Admission: RE | Admit: 2015-01-14 | Discharge: 2015-01-14 | Disposition: A | Discharge status: Home / Self Care | Payer: Medicaid Other | Source: Ambulatory Visit | Attending: Obstetrics | Admitting: Obstetrics

## 2015-01-14 ENCOUNTER — Ambulatory Visit (INDEPENDENT_AMBULATORY_CARE_PROVIDER_SITE_OTHER): Payer: Medicaid Other | Admitting: Obstetrics

## 2015-01-14 ENCOUNTER — Other Ambulatory Visit (HOSPITAL_COMMUNITY): Payer: Self-pay | Admitting: Obstetrics and Gynecology

## 2015-01-14 ENCOUNTER — Encounter: Payer: Self-pay | Admitting: Obstetrics

## 2015-01-14 ENCOUNTER — Encounter (HOSPITAL_COMMUNITY): Payer: Self-pay

## 2015-01-14 VITALS — BP 112/69 | HR 73 | Temp 98.2°F | Wt 218.0 lb

## 2015-01-14 DIAGNOSIS — O09523 Supervision of elderly multigravida, third trimester: Secondary | ICD-10-CM | POA: Insufficient documentation

## 2015-01-14 DIAGNOSIS — O99283 Endocrine, nutritional and metabolic diseases complicating pregnancy, third trimester: Secondary | ICD-10-CM | POA: Diagnosis not present

## 2015-01-14 DIAGNOSIS — Z3483 Encounter for supervision of other normal pregnancy, third trimester: Secondary | ICD-10-CM

## 2015-01-14 DIAGNOSIS — Z23 Encounter for immunization: Secondary | ICD-10-CM

## 2015-01-14 DIAGNOSIS — O3663X0 Maternal care for excessive fetal growth, third trimester, not applicable or unspecified: Secondary | ICD-10-CM | POA: Insufficient documentation

## 2015-01-14 DIAGNOSIS — Z3A32 32 weeks gestation of pregnancy: Secondary | ICD-10-CM | POA: Diagnosis not present

## 2015-01-14 DIAGNOSIS — IMO0002 Reserved for concepts with insufficient information to code with codable children: Secondary | ICD-10-CM

## 2015-01-14 DIAGNOSIS — Z3A36 36 weeks gestation of pregnancy: Secondary | ICD-10-CM

## 2015-01-14 DIAGNOSIS — E039 Hypothyroidism, unspecified: Secondary | ICD-10-CM | POA: Insufficient documentation

## 2015-01-14 LAB — POCT URINALYSIS DIPSTICK
BILIRUBIN UA: NEGATIVE
Blood, UA: NEGATIVE
GLUCOSE UA: NEGATIVE
Nitrite, UA: NEGATIVE
Protein, UA: NEGATIVE
Spec Grav, UA: 1.02
Urobilinogen, UA: NEGATIVE
pH, UA: 5

## 2015-01-14 MED ORDER — TETANUS-DIPHTH-ACELL PERTUSSIS 5-2.5-18.5 LF-MCG/0.5 IM SUSP
0.5000 mL | Freq: Once | INTRAMUSCULAR | Status: AC
  Administered 2015-01-14: 0.5 mL via INTRAMUSCULAR

## 2015-01-14 NOTE — Progress Notes (Signed)
Subjective:    Linda Cortez is a 34 y.o. female being seen today for her obstetrical visit. She is at 1943w0d gestation. Patient reports no complaints. Fetal movement: normal.  Problem List Items Addressed This Visit    None    Visit Diagnoses    Encounter for supervision of other normal pregnancy in third trimester    -  Primary    Relevant Medications    Tdap (BOOSTRIX) injection 0.5 mL (Completed)    Other Relevant Orders    POCT urinalysis dipstick (Completed)      Patient Active Problem List   Diagnosis Date Noted  . Thyroid activity decreased   . [redacted] weeks gestation of pregnancy   . [redacted] weeks gestation of pregnancy   . AMA (advanced maternal age) multigravida 35+   . Evaluate anatomy not seen on prior sonogram   . Encounter for fetal anatomic survey   . Advanced maternal age in multigravida   . [redacted] weeks gestation of pregnancy   . Hypothyroid in pregnancy, antepartum   . Vitamin D deficiency 09/10/2014  . Elderly multigravida 09/05/2014  . Asthma 09/05/2014  . Hypothyroidism 09/05/2014   Objective:    BP 112/69 mmHg  Pulse 73  Temp(Src) 98.2 F (36.8 C)  Wt 218 lb (98.884 kg)  LMP 06/04/2014 (Approximate) FHT:  160 BPM  Uterine Size: size equals dates  Presentation: unsure     Assessment:    Pregnancy @ 3843w0d weeks   Plan:     labs reviewed, problem list updated Consent signed. GBS sent TDAP offered  Rhogam given for RH negative Pediatrician: discussed. Infant feeding: plans to breastfeed. Maternity leave: discussed. Cigarette smoking: former smoker. Orders Placed This Encounter  Procedures  . POCT urinalysis dipstick   Meds ordered this encounter  Medications  . Tdap (BOOSTRIX) injection 0.5 mL    Sig:    Follow up in 2 Weeks.

## 2015-01-23 ENCOUNTER — Inpatient Hospital Stay (HOSPITAL_COMMUNITY)
Admission: AD | Admit: 2015-01-23 | Discharge: 2015-01-23 | Disposition: A | Discharge status: Home / Self Care | Payer: Medicaid Other | Source: Ambulatory Visit | Attending: Obstetrics | Admitting: Obstetrics

## 2015-01-23 ENCOUNTER — Encounter (HOSPITAL_COMMUNITY): Payer: Self-pay | Admitting: *Deleted

## 2015-01-23 DIAGNOSIS — R109 Unspecified abdominal pain: Secondary | ICD-10-CM | POA: Insufficient documentation

## 2015-01-23 DIAGNOSIS — O212 Late vomiting of pregnancy: Secondary | ICD-10-CM | POA: Diagnosis not present

## 2015-01-23 DIAGNOSIS — O219 Vomiting of pregnancy, unspecified: Secondary | ICD-10-CM | POA: Diagnosis not present

## 2015-01-23 DIAGNOSIS — Z3A33 33 weeks gestation of pregnancy: Secondary | ICD-10-CM | POA: Diagnosis not present

## 2015-01-23 DIAGNOSIS — N390 Urinary tract infection, site not specified: Secondary | ICD-10-CM

## 2015-01-23 LAB — CBC
HCT: 36.6 % (ref 36.0–46.0)
Hemoglobin: 12.6 g/dL (ref 12.0–15.0)
MCH: 29.9 pg (ref 26.0–34.0)
MCHC: 34.4 g/dL (ref 30.0–36.0)
MCV: 86.9 fL (ref 78.0–100.0)
Platelets: 169 10*3/uL (ref 150–400)
RBC: 4.21 MIL/uL (ref 3.87–5.11)
RDW: 13.5 % (ref 11.5–15.5)
WBC: 12.2 10*3/uL — ABNORMAL HIGH (ref 4.0–10.5)

## 2015-01-23 LAB — COMPREHENSIVE METABOLIC PANEL
ALT: 13 U/L (ref 0–35)
AST: 20 U/L (ref 0–37)
Albumin: 3.3 g/dL — ABNORMAL LOW (ref 3.5–5.2)
Alkaline Phosphatase: 116 U/L (ref 39–117)
Anion gap: 11 (ref 5–15)
BUN: 10 mg/dL (ref 6–23)
CO2: 18 mmol/L — ABNORMAL LOW (ref 19–32)
Calcium: 8.4 mg/dL (ref 8.4–10.5)
Chloride: 107 mmol/L (ref 96–112)
Creatinine, Ser: 0.51 mg/dL (ref 0.50–1.10)
GFR calc Af Amer: >90 mL/min (ref >90)
GFR calc non Af Amer: >90 mL/min (ref >90)
Glucose, Bld: 81 mg/dL (ref 70–99)
Potassium: 4 mmol/L (ref 3.5–5.1)
Sodium: 136 mmol/L (ref 135–145)
Total Bilirubin: 0.5 mg/dL (ref 0.3–1.2)
Total Protein: 7.1 g/dL (ref 6.0–8.3)

## 2015-01-23 LAB — URINALYSIS, ROUTINE W REFLEX MICROSCOPIC
Bilirubin Urine: NEGATIVE
Glucose, UA: NEGATIVE mg/dL
KETONES UR: NEGATIVE mg/dL
NITRITE: NEGATIVE
PH: 6 (ref 5.0–8.0)
Protein, ur: 30 mg/dL — AB
Urobilinogen, UA: 0.2 mg/dL (ref 0.0–1.0)

## 2015-01-23 LAB — URINE MICROSCOPIC-ADD ON

## 2015-01-23 MED ORDER — SODIUM CHLORIDE 0.9 % IV SOLN
8.0000 mg | Freq: Once | INTRAVENOUS | Status: AC
  Administered 2015-01-23: 8 mg via INTRAVENOUS
  Filled 2015-01-23: qty 4

## 2015-01-23 MED ORDER — LACTATED RINGERS IV SOLN
INTRAVENOUS | Status: DC
  Administered 2015-01-23: 14:00:00 via INTRAVENOUS

## 2015-01-23 MED ORDER — ONDANSETRON HCL 8 MG PO TABS: 8.0000 mg | ORAL_TABLET | Freq: Three times a day (TID) | ORAL | Status: DC | PRN

## 2015-01-23 MED ORDER — NITROFURANTOIN MONOHYD MACRO 100 MG PO CAPS: 100.0000 mg | ORAL_CAPSULE | Freq: Two times a day (BID) | ORAL | Status: AC

## 2015-01-23 NOTE — Discharge Instructions (Signed)
Nausea and Vomiting °Nausea is a sick feeling that often comes before throwing up (vomiting). Vomiting is a reflex where stomach contents come out of your mouth. Vomiting can cause severe loss of body fluids (dehydration). Children and elderly adults can become dehydrated quickly, especially if they also have diarrhea. Nausea and vomiting are symptoms of a condition or disease. It is important to find the cause of your symptoms. °CAUSES  °· Direct irritation of the stomach lining. This irritation can result from increased acid production (gastroesophageal reflux disease), infection, food poisoning, taking certain medicines (such as nonsteroidal anti-inflammatory drugs), alcohol use, or tobacco use. °· Signals from the brain. These signals could be caused by a headache, heat exposure, an inner ear disturbance, increased pressure in the brain from injury, infection, a tumor, or a concussion, pain, emotional stimulus, or metabolic problems. °· An obstruction in the gastrointestinal tract (bowel obstruction). °· Illnesses such as diabetes, hepatitis, gallbladder problems, appendicitis, kidney problems, cancer, sepsis, atypical symptoms of a heart attack, or eating disorders. °· Medical treatments such as chemotherapy and radiation. °· Receiving medicine that makes you sleep (general anesthetic) during surgery. °DIAGNOSIS °Your caregiver may ask for tests to be done if the problems do not improve after a few days. Tests may also be done if symptoms are severe or if the reason for the nausea and vomiting is not clear. Tests may include: °· Urine tests. °· Blood tests. °· Stool tests. °· Cultures (to look for evidence of infection). °· X-rays or other imaging studies. °Test results can help your caregiver make decisions about treatment or the need for additional tests. °TREATMENT °You need to stay well hydrated. Drink frequently but in small amounts. You may wish to drink water, sports drinks, clear broth, or eat frozen  ice pops or gelatin dessert to help stay hydrated. When you eat, eating slowly may help prevent nausea. There are also some antinausea medicines that may help prevent nausea. °HOME CARE INSTRUCTIONS  °· Take all medicine as directed by your caregiver. °· If you do not have an appetite, do not force yourself to eat. However, you must continue to drink fluids. °· If you have an appetite, eat a normal diet unless your caregiver tells you differently. °¨ Eat a variety of complex carbohydrates (rice, wheat, potatoes, bread), lean meats, yogurt, fruits, and vegetables. °¨ Avoid high-fat foods because they are more difficult to digest. °· Drink enough water and fluids to keep your urine clear or pale yellow. °· If you are dehydrated, ask your caregiver for specific rehydration instructions. Signs of dehydration may include: °¨ Severe thirst. °¨ Dry lips and mouth. °¨ Dizziness. °¨ Dark urine. °¨ Decreasing urine frequency and amount. °¨ Confusion. °¨ Rapid breathing or pulse. °SEEK IMMEDIATE MEDICAL CARE IF:  °· You have blood or brown flecks (like coffee grounds) in your vomit. °· You have black or bloody stools. °· You have a severe headache or stiff neck. °· You are confused. °· You have severe abdominal pain. °· You have chest pain or trouble breathing. °· You do not urinate at least once every 8 hours. °· You develop cold or clammy skin. °· You continue to vomit for longer than 24 to 48 hours. °· You have a fever. °MAKE SURE YOU:  °· Understand these instructions. °· Will watch your condition. °· Will get help right away if you are not doing well or get worse. °Document Released: 09/20/2005 Document Revised: 12/13/2011 Document Reviewed: 02/17/2011 °[employer]® Patient Information ©2015 [employer]. This information is not intended   to replace advice given to you by your health care provider. Make sure you discuss any questions you have with your health care provider. Pregnancy and Urinary Tract Infection A urinary  tract infection (UTI) is a bacterial infection of the urinary tract. Infection of the urinary tract can include the ureters, kidneys (pyelonephritis), bladder (cystitis), and urethra (urethritis). All pregnant women should be screened for bacteria in the urinary tract. Identifying and treating a UTI will decrease the risk of preterm labor and developing more serious infections in both the mother and baby. CAUSES Bacteria germs cause almost all UTIs.  RISK FACTORS Many factors can increase your chances of getting a UTI during pregnancy. These include:  Having a short urethra.  Poor toilet and hygiene habits.  Sexual intercourse.  Blockage of urine along the urinary tract.  Problems with the pelvic muscles or nerves.  Diabetes.  Obesity.  Bladder problems after having several children.  Previous history of UTI. SIGNS AND SYMPTOMS   Pain, burning, or a stinging feeling when urinating.  Suddenly feeling the need to urinate right away (urgency).  Loss of bladder control (urinary incontinence).  Frequent urination, more than is common with pregnancy.  Lower abdominal or back discomfort.  Cloudy urine.  Blood in the urine (hematuria).  Fever. When the kidneys are infected, the symptoms may be:  Back pain.  Flank pain on the right side more so than the left.  Fever.  Chills.  Nausea.  Vomiting. DIAGNOSIS  A urinary tract infection is usually diagnosed through urine tests. Additional tests and procedures are sometimes done. These may include:  Ultrasound exam of the kidneys, ureters, bladder, and urethra.  Looking in the bladder with a lighted tube (cystoscopy). TREATMENT Typically, UTIs can be treated with antibiotic medicines.  HOME CARE INSTRUCTIONS   Only take over-the-counter or prescription medicines as directed by your health care provider. If you were prescribed antibiotics, take them as directed. Finish them even if you start to feel better.  Drink  enough fluids to keep your urine clear or pale yellow.  Do not have sexual intercourse until the infection is gone and your health care provider says it is okay.  Make sure you are tested for UTIs throughout your pregnancy. These infections often come back. Preventing a UTI in the Future  Practice good toilet habits. Always wipe from front to back. Use the tissue only once.  Do not hold your urine. Empty your bladder as soon as possible when the urge comes.  Do not douche or use deodorant sprays.  Wash with soap and warm water around the genital area and the anus.  Empty your bladder before and after sexual intercourse.  Wear underwear with a cotton crotch.  Avoid caffeine and carbonated drinks. They can irritate the bladder.  Drink cranberry juice or take cranberry pills. This may decrease the risk of getting a UTI.  Do not drink alcohol.  Keep all your appointments and tests as scheduled. SEEK MEDICAL CARE IF:   Your symptoms get worse.  You are still having fevers 2 or more days after treatment begins.  You have a rash.  You feel that you are having problems with medicines prescribed.  You have abnormal vaginal discharge. SEEK IMMEDIATE MEDICAL CARE IF:   You have back or flank pain.  You have chills.  You have blood in your urine.  You have nausea and vomiting.  You have contractions of your uterus.  You have a gush of fluid from the vagina.  MAKE SURE YOU:  Understand these instructions.   Will watch your condition.   Will get help right away if you are not doing well or get worse.  Document Released: 01/15/2011 Document Revised: 07/11/2013 Document Reviewed: 04/19/2013 [facility] Patient Information 2015 [place], [place]. This information is not intended to replace advice given to you by your health care provider. Make sure you discuss any questions you have with your health care provider.

## 2015-01-23 NOTE — MAU Provider Note (Signed)
History     CSN: [id number]  Arrival date and time: 01/23/15 1213   None     Chief Complaint  Patient presents with  . Emesis During Pregnancy   Emesis  This is a new problem. The current episode started today. The problem occurs intermittently. The problem has been gradually worsening. The emesis has an appearance of stomach contents. There has been no fever. Associated symptoms include abdominal pain. Risk factors include suspect food intake. She has tried nothing for the symptoms.    34 y.o. [id number] @[redacted]w[redacted]d  presents to MAU stating that she started having nausea and vomiting at 830 this morning . Denies vaginal bleeding, LOF, contractions. Reports positive fetal movement  Past Medical History  Diagnosis Date  . Thyroid disease   . Glaucoma   . Anxiety   . Bradycardia     Past Surgical History  Procedure Laterality Date  . Pilonial cyst removed     . Breast surgery      Cyst removal     Family History  Problem Relation Age of Onset  . Cancer Mother     History  Substance Use Topics  . Smoking status: Former Games developermoker  . Smokeless tobacco: Not on file  . Alcohol Use: No    Allergies:  Allergies  Allergen Reactions  . Augmentin [Amoxicillin-Pot Clavulanate] Rash  . Penicillins Rash    Prescriptions prior to admission  Medication Sig Dispense Refill Last Dose  . levothyroxine (SYNTHROID, LEVOTHROID) 200 MCG tablet Take 200 mcg by mouth daily before breakfast.   01/22/2015 at Unknown time  . hydrOXYzine (VISTARIL) 25 MG capsule Take 1 capsule (25 mg total) by mouth 3 (three) times daily as needed for anxiety. (Patient not taking: Reported on 12/02/2014) 30 capsule 1 Not Taking  . nitrofurantoin, macrocrystal-monohydrate, (MACROBID) 100 MG capsule Take 1 capsule (100 mg total) by mouth 2 (two) times daily. (Patient not taking: Reported on 01/14/2015) 14 capsule 2 Not Taking  . sertraline (ZOLOFT) 50 MG tablet Take 1 tablet (50 mg total) by mouth daily. (Patient not  taking: Reported on 12/02/2014) 30 tablet 1 Not Taking    Review of Systems  Gastrointestinal: Positive for nausea, vomiting and abdominal pain.  Genitourinary: Positive for dysuria.  All other systems reviewed and are negative.  Physical Exam   Blood pressure 118/56, pulse 83, temperature 97.4 F (36.3 C), temperature source Axillary, resp. rate 18, last menstrual period 06/04/2014.  Results for orders placed or performed during the hospital encounter of 01/23/15 (from the past 24 hour(s))  Urinalysis, Routine w reflex microscopic     Status: Abnormal   Collection Time: 01/23/15 12:41 PM  Result Value Ref Range   Color, Urine YELLOW YELLOW   APPearance CLOUDY (A) CLEAR   Specific Gravity, Urine >1.030 (H) 1.005 - 1.030   pH 6.0 5.0 - 8.0   Glucose, UA NEGATIVE NEGATIVE mg/dL   Hgb urine dipstick TRACE (A) NEGATIVE   Bilirubin Urine NEGATIVE NEGATIVE   Ketones, ur NEGATIVE NEGATIVE mg/dL   Protein, ur 30 (A) NEGATIVE mg/dL   Urobilinogen, UA 0.2 0.0 - 1.0 mg/dL   Nitrite NEGATIVE NEGATIVE   Leukocytes, UA MODERATE (A) NEGATIVE  Urine microscopic-add on     Status: Abnormal   Collection Time: 01/23/15 12:41 PM  Result Value Ref Range   Squamous Epithelial / LPF MANY (A) RARE   WBC, UA 7-10 <3 WBC/hpf   RBC / HPF 0-2 <3 RBC/hpf   Bacteria, UA FEW (A) RARE   Urine-Other  MUCOUS PRESENT   CBC     Status: Abnormal   Collection Time: 01/23/15  2:05 PM  Result Value Ref Range   WBC 12.2 (H) 4.0 - 10.5 K/uL   RBC 4.21 3.87 - 5.11 MIL/uL   Hemoglobin 12.6 12.0 - 15.0 g/dL   HCT 13.0 86.5 - 78.4 %   MCV 86.9 78.0 - 100.0 fL   MCH 29.9 26.0 - 34.0 pg   MCHC 34.4 30.0 - 36.0 g/dL   RDW 69.6 29.5 - 28.4 %   Platelets 169 150 - 400 K/uL  Comprehensive metabolic panel     Status: Abnormal   Collection Time: 01/23/15  2:05 PM  Result Value Ref Range   Sodium 136 135 - 145 mmol/L   Potassium 4.0 3.5 - 5.1 mmol/L   Chloride 107 96 - 112 mmol/L   CO2 18 (L) 19 - 32 mmol/L    Glucose, Bld 81 70 - 99 mg/dL   BUN 10 6 - 23 mg/dL   Creatinine, Ser 1.32 0.50 - 1.10 mg/dL   Calcium 8.4 8.4 - 44.0 mg/dL   Total Protein 7.1 6.0 - 8.3 g/dL   Albumin 3.3 (L) 3.5 - 5.2 g/dL   AST 20 0 - 37 U/L   ALT 13 0 - 35 U/L   Alkaline Phosphatase 116 39 - 117 U/L   Total Bilirubin 0.5 0.3 - 1.2 mg/dL   GFR calc non Af Amer >90 >90 mL/min   GFR calc Af Amer >90 >90 mL/min   Anion gap 11 5 - 15    Physical Exam  Nursing note and vitals reviewed. Constitutional: She is oriented to person, place, and time. She appears well-developed and well-nourished. No distress.  HENT:  Head: Normocephalic and atraumatic.  Neck: Normal range of motion.  Cardiovascular: Normal rate.   Respiratory: Effort normal. No respiratory distress.  GI: Soft. She exhibits no distension and no mass. There is no tenderness. There is no rebound and no guarding.  Musculoskeletal: Normal range of motion.  Neurological: She is alert and oriented to person, place, and time.  Skin: Skin is warm and dry.  Psychiatric: She has a normal mood and affect. Her behavior is normal. Judgment and thought content normal.    MAU Course  Procedures  MDM Ua Cbc cmp Iv hydration Antiemetics Category 1 Spoke to Dr Clearance Coots re pt labs and status  Assessment and Plan  Nausea and vomiting  UTI  RX: Macrobid; Zofran Keep next regular scheduled appt Discharge to home  [facility] 01/23/2015, 4:09 PM

## 2015-01-23 NOTE — MAU Note (Signed)
C/o feeling abdominal tightness that is followed by vomiting since 0830 this AM; hx of hyperemesis with this pregnancy;

## 2015-01-23 NOTE — MAU Note (Signed)
Pt woke up @ 0800, has been vomiting ever since, denies diarrhea.  No fever.  Has upper abd cramping, denies bleeding or LOF.

## 2015-01-24 LAB — CULTURE, OB URINE
Colony Count: 30000
Special Requests: NORMAL

## 2015-01-28 ENCOUNTER — Ambulatory Visit (INDEPENDENT_AMBULATORY_CARE_PROVIDER_SITE_OTHER): Payer: Medicaid Other | Admitting: Obstetrics

## 2015-01-28 ENCOUNTER — Encounter: Payer: Self-pay | Admitting: Obstetrics

## 2015-01-28 VITALS — BP 110/52 | HR 67 | Wt 220.0 lb

## 2015-01-28 DIAGNOSIS — Z3483 Encounter for supervision of other normal pregnancy, third trimester: Secondary | ICD-10-CM

## 2015-01-28 LAB — POCT URINALYSIS DIPSTICK
Bilirubin, UA: NEGATIVE
Glucose, UA: NEGATIVE
KETONES UA: NEGATIVE
NITRITE UA: NEGATIVE
PH UA: 6.5
Spec Grav, UA: 1.015
Urobilinogen, UA: NEGATIVE

## 2015-01-28 NOTE — Progress Notes (Signed)
Subjective:    Linda Cortez is a 34 y.o. female being seen today for her obstetrical visit. She is at 3922w0d gestation. Patient reports no complaints. Fetal movement: normal.  Problem List Items Addressed This Visit    None     Patient Active Problem List   Diagnosis Date Noted  . Thyroid activity decreased   . [redacted] weeks gestation of pregnancy   . [redacted] weeks gestation of pregnancy   . AMA (advanced maternal age) multigravida 35+   . Evaluate anatomy not seen on prior sonogram   . Encounter for fetal anatomic survey   . Advanced maternal age in multigravida   . [redacted] weeks gestation of pregnancy   . Hypothyroid in pregnancy, antepartum   . Vitamin D deficiency 09/10/2014  . Elderly multigravida 09/05/2014  . Asthma 09/05/2014  . Hypothyroidism 09/05/2014   Objective:    BP 110/52 mmHg  Pulse 67  Wt 220 lb (99.791 kg)  LMP 06/04/2014 (Approximate) FHT:  160 BPM  Uterine Size: size equals dates  Presentation: unsure     Assessment:    Pregnancy @ 6322w0d weeks   Plan:     labs reviewed, problem list updated Consent signed. GBS sent TDAP offered  Rhogam given for RH negative Pediatrician: discussed. Infant feeding: plans to breastfeed. Maternity leave: discussed. Cigarette smoking: former smoker. No orders of the defined types were placed in this encounter.   No orders of the defined types were placed in this encounter.   Follow up in 1 Week.

## 2015-02-04 ENCOUNTER — Ambulatory Visit (INDEPENDENT_AMBULATORY_CARE_PROVIDER_SITE_OTHER): Payer: Medicaid Other | Admitting: Obstetrics

## 2015-02-04 VITALS — BP 111/57 | HR 73 | Temp 98.3°F | Wt 224.0 lb

## 2015-02-04 DIAGNOSIS — Z3483 Encounter for supervision of other normal pregnancy, third trimester: Secondary | ICD-10-CM

## 2015-02-04 LAB — POCT URINALYSIS DIPSTICK
BILIRUBIN UA: NEGATIVE
GLUCOSE UA: NEGATIVE
Ketones, UA: NEGATIVE
NITRITE UA: NEGATIVE
RBC UA: 50
SPEC GRAV UA: 1.01
UROBILINOGEN UA: NEGATIVE
pH, UA: 5

## 2015-02-04 LAB — OB RESULTS CONSOLE GBS: STREP GROUP B AG: NEGATIVE

## 2015-02-05 ENCOUNTER — Encounter: Payer: Self-pay | Admitting: Obstetrics

## 2015-02-05 NOTE — Progress Notes (Signed)
Subjective:    Linda Cortez is a 34 y.o. female being seen today for her obstetrical visit. She is at 5761w1d gestation. Patient reports no complaints. Fetal movement: normal.  Problem List Items Addressed This Visit    None    Visit Diagnoses    Encounter for supervision of other normal pregnancy in third trimester    -  Primary    Relevant Orders    Culture, Grp B Strep w/Rflx Suscept    POCT urinalysis dipstick (Completed)      Patient Active Problem List   Diagnosis Date Noted  . Thyroid activity decreased   . [redacted] weeks gestation of pregnancy   . [redacted] weeks gestation of pregnancy   . AMA (advanced maternal age) multigravida 35+   . Evaluate anatomy not seen on prior sonogram   . Encounter for fetal anatomic survey   . Advanced maternal age in multigravida   . [redacted] weeks gestation of pregnancy   . Hypothyroid in pregnancy, antepartum   . Vitamin D deficiency 09/10/2014  . Elderly multigravida 09/05/2014  . Asthma 09/05/2014  . Hypothyroidism 09/05/2014   Objective:    BP 111/57 mmHg  Pulse 73  Temp(Src) 98.3 F (36.8 C)  Wt 224 lb (101.606 kg)  LMP 06/04/2014 (Approximate) FHT:  160 BPM  Uterine Size: size equals dates  Presentation: unsure     Assessment:    Pregnancy @ 7561w1d weeks   Plan:     labs reviewed, problem list updated Consent signed. GBS sent TDAP offered  Rhogam given for RH negative Pediatrician: discussed. Infant feeding: plans to breastfeed. Maternity leave: discussed. Cigarette smoking: former smoker. Orders Placed This Encounter  Procedures  . Culture, Grp B Strep w/Rflx Suscept  . POCT urinalysis dipstick   No orders of the defined types were placed in this encounter.   Follow up in 1 Week.

## 2015-02-06 LAB — CULTURE, STREPTOCOCCUS GRP B W/SUSCEPT

## 2015-02-07 ENCOUNTER — Telehealth: Payer: Self-pay | Admitting: Obstetrics

## 2015-02-11 ENCOUNTER — Ambulatory Visit (HOSPITAL_COMMUNITY)
Admission: RE | Admit: 2015-02-11 | Discharge: 2015-02-11 | Disposition: A | Discharge status: Home / Self Care | Payer: Medicaid Other | Source: Ambulatory Visit | Attending: Obstetrics | Admitting: Obstetrics

## 2015-02-11 ENCOUNTER — Ambulatory Visit (INDEPENDENT_AMBULATORY_CARE_PROVIDER_SITE_OTHER): Payer: Medicaid Other | Admitting: Obstetrics

## 2015-02-11 ENCOUNTER — Encounter (HOSPITAL_COMMUNITY): Payer: Self-pay

## 2015-02-11 VITALS — BP 116/64 | HR 72 | Wt 224.0 lb

## 2015-02-11 DIAGNOSIS — O09523 Supervision of elderly multigravida, third trimester: Secondary | ICD-10-CM

## 2015-02-11 DIAGNOSIS — Z3A36 36 weeks gestation of pregnancy: Secondary | ICD-10-CM

## 2015-02-11 DIAGNOSIS — O26843 Uterine size-date discrepancy, third trimester: Secondary | ICD-10-CM | POA: Diagnosis not present

## 2015-02-11 DIAGNOSIS — E039 Hypothyroidism, unspecified: Secondary | ICD-10-CM | POA: Diagnosis not present

## 2015-02-11 DIAGNOSIS — IMO0002 Reserved for concepts with insufficient information to code with codable children: Secondary | ICD-10-CM

## 2015-02-11 DIAGNOSIS — Z1389 Encounter for screening for other disorder: Secondary | ICD-10-CM

## 2015-02-11 DIAGNOSIS — Z3483 Encounter for supervision of other normal pregnancy, third trimester: Secondary | ICD-10-CM

## 2015-02-11 DIAGNOSIS — O99283 Endocrine, nutritional and metabolic diseases complicating pregnancy, third trimester: Secondary | ICD-10-CM | POA: Diagnosis present

## 2015-02-11 DIAGNOSIS — Z331 Pregnant state, incidental: Secondary | ICD-10-CM

## 2015-02-11 LAB — POCT URINALYSIS DIPSTICK
BILIRUBIN UA: NEGATIVE
Blood, UA: NEGATIVE
Glucose, UA: NEGATIVE
KETONES UA: NEGATIVE
Nitrite, UA: NEGATIVE
Protein, UA: NEGATIVE
Spec Grav, UA: 1.01
Urobilinogen, UA: NEGATIVE
pH, UA: 8

## 2015-02-12 ENCOUNTER — Encounter: Payer: Self-pay | Admitting: Obstetrics

## 2015-02-12 NOTE — Progress Notes (Signed)
Subjective:    Linda Cortez is a [age] y.o. female being seen today for her obstetrical visit. She is at 2861w1d gestation. Patient reports no complaints. Fetal movement: normal.  Problem List Items Addressed This Visit    None    Visit Diagnoses    Encounter for supervision of other normal pregnancy in third trimester    -  Primary    Relevant Orders    POCT urinalysis dipstick (Completed)      Patient Active Problem List   Diagnosis Date Noted  . Thyroid activity decreased   . [redacted] weeks gestation of pregnancy   . [redacted] weeks gestation of pregnancy   . AMA (advanced maternal age) multigravida 35+   . Evaluate anatomy not seen on prior sonogram   . Encounter for fetal anatomic survey   . Advanced maternal age in multigravida   . [redacted] weeks gestation of pregnancy   . Hypothyroid in pregnancy, antepartum   . Vitamin D deficiency 09/10/2014  . Elderly multigravida 09/05/2014  . Asthma 09/05/2014  . Hypothyroidism 09/05/2014   Objective:    BP 116/64 mmHg  Pulse 72  Wt 224 lb (101.606 kg)  LMP 06/04/2014 (Approximate) FHT:  160 BPM  Uterine Size: size equals dates  Presentation: unsure     Assessment:    Pregnancy @ 2761w1d weeks   Plan:     labs reviewed, problem list updated Consent signed. GBS sent TDAP offered  Rhogam given for RH negative Pediatrician: discussed. Infant feeding: plans to breastfeed. Maternity leave: discussed. Cigarette smoking: former smoker. Orders Placed This Encounter  Procedures  . POCT urinalysis dipstick   No orders of the defined types were placed in this encounter.   Follow up in 1 Week.

## 2015-02-13 ENCOUNTER — Inpatient Hospital Stay (HOSPITAL_COMMUNITY)
Admission: AD | Admit: 2015-02-13 | Discharge: 2015-02-13 | Disposition: A | Discharge status: Home / Self Care | Payer: Medicaid Other | Source: Ambulatory Visit | Attending: Obstetrics | Admitting: Obstetrics

## 2015-02-13 DIAGNOSIS — Z3A36 36 weeks gestation of pregnancy: Secondary | ICD-10-CM | POA: Insufficient documentation

## 2015-02-13 NOTE — MAU Note (Signed)
Contractions irregularly  Denies bright red vaginal bleeding.  Denies bloody show.  Positive but reduced fetal movement Denies SROM/LOF  Denies any infections/complications of pregnancy  GBS unknown per patient

## 2015-02-17 NOTE — Telephone Encounter (Signed)
[id number] - Patient has rescheduled appt. brm

## 2015-02-18 ENCOUNTER — Encounter: Payer: Medicaid Other | Admitting: Obstetrics

## 2015-02-18 ENCOUNTER — Encounter: Payer: Medicaid Other | Admitting: Certified Nurse Midwife

## 2015-02-18 ENCOUNTER — Ambulatory Visit (INDEPENDENT_AMBULATORY_CARE_PROVIDER_SITE_OTHER): Payer: Medicaid Other | Admitting: Obstetrics

## 2015-02-18 VITALS — BP 103/68 | HR 73 | Temp 97.6°F | Wt 229.0 lb

## 2015-02-18 DIAGNOSIS — Z3483 Encounter for supervision of other normal pregnancy, third trimester: Secondary | ICD-10-CM | POA: Diagnosis not present

## 2015-02-18 LAB — POCT URINALYSIS DIPSTICK
Blood, UA: 250
GLUCOSE UA: NEGATIVE
KETONES UA: NEGATIVE
Nitrite, UA: NEGATIVE
Protein, UA: 1
Spec Grav, UA: 1.015
pH, UA: 6

## 2015-02-18 NOTE — Progress Notes (Signed)
Patient is doing well with irregular contractions- was checked last week at 1 cm- patient is ready.

## 2015-02-19 ENCOUNTER — Encounter: Payer: Self-pay | Admitting: Obstetrics

## 2015-02-19 NOTE — Progress Notes (Signed)
Subjective:    Linda Cortez is a 34 y.o. female being seen today for her obstetrical visit. She is at 2466w1d gestation. Patient reports no complaints. Fetal movement: normal.  Problem List Items Addressed This Visit    None    Visit Diagnoses    Encounter for supervision of other normal pregnancy in third trimester    -  Primary    Relevant Orders    POCT urinalysis dipstick (Completed)      Patient Active Problem List   Diagnosis Date Noted  . Thyroid activity decreased   . [redacted] weeks gestation of pregnancy   . [redacted] weeks gestation of pregnancy   . AMA (advanced maternal age) multigravida 35+   . Evaluate anatomy not seen on prior sonogram   . Encounter for fetal anatomic survey   . Advanced maternal age in multigravida   . [redacted] weeks gestation of pregnancy   . Hypothyroid in pregnancy, antepartum   . Vitamin D deficiency 09/10/2014  . Elderly multigravida 09/05/2014  . Asthma 09/05/2014  . Hypothyroidism 09/05/2014    Objective:    BP 103/68 mmHg  Pulse 73  Temp(Src) 97.6 F (36.4 C)  Wt 229 lb (103.874 kg)  LMP 06/04/2014 (Approximate) FHT: 150 BPM  Uterine Size: size equals dates  Presentations: unsure  Pelvic Exam: Deferred    Assessment:    Pregnancy @ 766w1d weeks   Plan:   Plans for delivery: Vaginal anticipated; labs reviewed; problem list updated Counseling: Consent signed. Infant feeding: plans to breastfeed. Cigarette smoking: former smoker. L&D discussion: symptoms of labor, discussed when to call, discussed what number to call, anesthetic/analgesic options reviewed and delivering clinician:  plans Physician. Postpartum supports and preparation: circumcision discussed and contraception plans discussed.  Follow up in 1 Week.

## 2015-02-23 ENCOUNTER — Encounter (HOSPITAL_COMMUNITY): Payer: Self-pay | Admitting: *Deleted

## 2015-02-23 ENCOUNTER — Inpatient Hospital Stay (HOSPITAL_COMMUNITY)
Admission: AD | Admit: 2015-02-23 | Discharge: 2015-02-23 | Disposition: A | Discharge status: Home / Self Care | Payer: Medicaid Other | Source: Ambulatory Visit | Attending: Obstetrics | Admitting: Obstetrics

## 2015-02-23 DIAGNOSIS — Z3A37 37 weeks gestation of pregnancy: Secondary | ICD-10-CM | POA: Insufficient documentation

## 2015-02-23 HISTORY — DX: Unspecified asthma, uncomplicated: J45.909

## 2015-02-23 LAB — URINE MICROSCOPIC-ADD ON

## 2015-02-23 LAB — URINALYSIS, ROUTINE W REFLEX MICROSCOPIC
Bilirubin Urine: NEGATIVE
Glucose, UA: NEGATIVE mg/dL
HGB URINE DIPSTICK: NEGATIVE
Ketones, ur: NEGATIVE mg/dL
Nitrite: NEGATIVE
PH: 6 (ref 5.0–8.0)
Protein, ur: NEGATIVE mg/dL
Specific Gravity, Urine: 1.01 (ref 1.005–1.030)
UROBILINOGEN UA: 0.2 mg/dL (ref 0.0–1.0)

## 2015-02-23 NOTE — MAU Note (Addendum)
Contractions consistent since 2300. Denies LOF or bleeding. Ctxs for several wks but consistent since 2300. Pelvic pressure and urinating "every 2 secs"

## 2015-02-25 ENCOUNTER — Ambulatory Visit (INDEPENDENT_AMBULATORY_CARE_PROVIDER_SITE_OTHER): Payer: Medicaid Other | Admitting: Obstetrics

## 2015-02-25 ENCOUNTER — Encounter: Payer: Self-pay | Admitting: Obstetrics

## 2015-02-25 VITALS — BP 102/65 | HR 76 | Temp 97.7°F | Wt 226.0 lb

## 2015-02-25 DIAGNOSIS — Z3483 Encounter for supervision of other normal pregnancy, third trimester: Secondary | ICD-10-CM | POA: Diagnosis not present

## 2015-02-25 LAB — POCT URINALYSIS DIPSTICK
BILIRUBIN UA: NEGATIVE
Blood, UA: NEGATIVE
GLUCOSE UA: NEGATIVE
Ketones, UA: NEGATIVE
Leukocytes, UA: NEGATIVE
NITRITE UA: NEGATIVE
Protein, UA: NEGATIVE
Spec Grav, UA: 1.01
Urobilinogen, UA: NEGATIVE
pH, UA: 7

## 2015-02-25 NOTE — Progress Notes (Signed)
Subjective:    Linda Cortez is a 34 y.o. female being seen today for her obstetrical visit. She is at 5725w0d gestation. Patient reports no complaints. Fetal movement: normal.  Problem List Items Addressed This Visit    None    Visit Diagnoses    Encounter for supervision of other normal pregnancy in third trimester    -  Primary    Relevant Orders    POCT urinalysis dipstick (Completed)      Patient Active Problem List   Diagnosis Date Noted  . Thyroid activity decreased   . [redacted] weeks gestation of pregnancy   . [redacted] weeks gestation of pregnancy   . AMA (advanced maternal age) multigravida 35+   . Evaluate anatomy not seen on prior sonogram   . Encounter for fetal anatomic survey   . Advanced maternal age in multigravida   . [redacted] weeks gestation of pregnancy   . Hypothyroid in pregnancy, antepartum   . Vitamin D deficiency 09/10/2014  . Elderly multigravida 09/05/2014  . Asthma 09/05/2014  . Hypothyroidism 09/05/2014    Objective:    BP 102/65 mmHg  Pulse 76  Temp(Src) 97.7 F (36.5 C)  Wt 226 lb (102.513 kg)  LMP 06/04/2014 (Approximate) FHT: 150 BPM  Uterine Size: size equals dates  Presentations: cephalic  Pelvic Exam:              Dilation: 2cm       Effacement: 50%             Station:  -3    Consistency: soft            Position: posterior     Assessment:    Pregnancy @ 6725w0d weeks   Plan:   Plans for delivery: Vaginal anticipated; labs reviewed; problem list updated Counseling: Consent signed. Infant feeding: plans to breastfeed. Cigarette smoking: former smoker. L&D discussion: symptoms of labor, discussed when to call, discussed what number to call, anesthetic/analgesic options reviewed and delivering clinician:  plans no preference. Postpartum supports and preparation: circumcision discussed and contraception plans discussed.  Follow up in 1 Week.

## 2015-02-26 ENCOUNTER — Telehealth (HOSPITAL_COMMUNITY): Payer: Self-pay | Admitting: *Deleted

## 2015-02-26 NOTE — Telephone Encounter (Signed)
Preadmission screen  

## 2015-03-04 ENCOUNTER — Inpatient Hospital Stay (HOSPITAL_COMMUNITY)
Admission: RE | Admit: 2015-03-04 | Discharge: 2015-03-06 | DRG: 775 | Disposition: A | Discharge status: Home / Self Care | Payer: Medicaid Other | Source: Ambulatory Visit | Attending: Obstetrics | Admitting: Obstetrics

## 2015-03-04 ENCOUNTER — Inpatient Hospital Stay (HOSPITAL_COMMUNITY): Payer: Medicaid Other | Admitting: Anesthesiology

## 2015-03-04 ENCOUNTER — Encounter (HOSPITAL_COMMUNITY): Payer: Self-pay

## 2015-03-04 ENCOUNTER — Encounter: Payer: Medicaid Other | Admitting: Obstetrics

## 2015-03-04 DIAGNOSIS — J45909 Unspecified asthma, uncomplicated: Secondary | ICD-10-CM | POA: Diagnosis present

## 2015-03-04 DIAGNOSIS — E039 Hypothyroidism, unspecified: Secondary | ICD-10-CM | POA: Diagnosis present

## 2015-03-04 DIAGNOSIS — F419 Anxiety disorder, unspecified: Secondary | ICD-10-CM | POA: Diagnosis present

## 2015-03-04 DIAGNOSIS — Z3A39 39 weeks gestation of pregnancy: Secondary | ICD-10-CM | POA: Diagnosis present

## 2015-03-04 DIAGNOSIS — O9952 Diseases of the respiratory system complicating childbirth: Secondary | ICD-10-CM | POA: Diagnosis present

## 2015-03-04 DIAGNOSIS — Z6841 Body Mass Index (BMI) 40.0 and over, adult: Secondary | ICD-10-CM | POA: Diagnosis not present

## 2015-03-04 DIAGNOSIS — K219 Gastro-esophageal reflux disease without esophagitis: Secondary | ICD-10-CM | POA: Diagnosis present

## 2015-03-04 DIAGNOSIS — O99284 Endocrine, nutritional and metabolic diseases complicating childbirth: Secondary | ICD-10-CM | POA: Diagnosis present

## 2015-03-04 DIAGNOSIS — O99214 Obesity complicating childbirth: Secondary | ICD-10-CM | POA: Diagnosis present

## 2015-03-04 DIAGNOSIS — O9962 Diseases of the digestive system complicating childbirth: Secondary | ICD-10-CM | POA: Diagnosis present

## 2015-03-04 DIAGNOSIS — O99344 Other mental disorders complicating childbirth: Secondary | ICD-10-CM | POA: Diagnosis present

## 2015-03-04 DIAGNOSIS — Z87891 Personal history of nicotine dependence: Secondary | ICD-10-CM | POA: Diagnosis not present

## 2015-03-04 DIAGNOSIS — O9989 Other specified diseases and conditions complicating pregnancy, childbirth and the puerperium: Secondary | ICD-10-CM | POA: Diagnosis present

## 2015-03-04 DIAGNOSIS — O0943 Supervision of pregnancy with grand multiparity, third trimester: Secondary | ICD-10-CM | POA: Diagnosis not present

## 2015-03-04 DIAGNOSIS — Z833 Family history of diabetes mellitus: Secondary | ICD-10-CM

## 2015-03-04 DIAGNOSIS — Z3490 Encounter for supervision of normal pregnancy, unspecified, unspecified trimester: Secondary | ICD-10-CM

## 2015-03-04 HISTORY — DX: Hypothyroidism, unspecified: E03.9

## 2015-03-04 LAB — CBC
HEMATOCRIT: 36 % (ref 36.0–46.0)
Hemoglobin: 12.3 g/dL (ref 12.0–15.0)
MCH: 29.6 pg (ref 26.0–34.0)
MCHC: 34.2 g/dL (ref 30.0–36.0)
MCV: 86.5 fL (ref 78.0–100.0)
Platelets: 163 10*3/uL (ref 150–400)
RBC: 4.16 MIL/uL (ref 3.87–5.11)
RDW: 13.7 % (ref 11.5–15.5)
WBC: 10.6 10*3/uL — AB (ref 4.0–10.5)

## 2015-03-04 LAB — TYPE AND SCREEN
ABO/RH(D): O POS
ANTIBODY SCREEN: NEGATIVE

## 2015-03-04 LAB — ABO/RH: ABO/RH(D): O POS

## 2015-03-04 MED ORDER — OXYTOCIN BOLUS FROM INFUSION
500.0000 mL | INTRAVENOUS | Status: DC
  Administered 2015-03-04: 500 mL via INTRAVENOUS

## 2015-03-04 MED ORDER — IBUPROFEN 600 MG PO TABS
600.0000 mg | ORAL_TABLET | Freq: Four times a day (QID) | ORAL | Status: DC
  Administered 2015-03-05 – 2015-03-06 (×6): 600 mg via ORAL
  Filled 2015-03-04 (×6): qty 1

## 2015-03-04 MED ORDER — LEVOTHYROXINE SODIUM 200 MCG PO TABS
200.0000 ug | ORAL_TABLET | Freq: Every day | ORAL | Status: DC
  Filled 2015-03-04: qty 1

## 2015-03-04 MED ORDER — TERBUTALINE SULFATE 1 MG/ML IJ SOLN
0.2500 mg | Freq: Once | INTRAMUSCULAR | Status: AC | PRN
  Filled 2015-03-04: qty 1

## 2015-03-04 MED ORDER — LACTATED RINGERS IV SOLN: 500.0000 mL | INTRAVENOUS | Status: DC | PRN

## 2015-03-04 MED ORDER — OXYTOCIN 40 UNITS IN LACTATED RINGERS INFUSION - SIMPLE MED: 62.5000 mL/h | INTRAVENOUS | Status: DC

## 2015-03-04 MED ORDER — FLEET ENEMA 7-19 GM/118ML RE ENEM: 1.0000 | ENEMA | RECTAL | Status: DC | PRN

## 2015-03-04 MED ORDER — ONDANSETRON HCL 4 MG/2ML IJ SOLN: 4.0000 mg | Freq: Four times a day (QID) | INTRAMUSCULAR | Status: DC | PRN

## 2015-03-04 MED ORDER — LIDOCAINE HCL (PF) 1 % IJ SOLN
30.0000 mL | INTRAMUSCULAR | Status: DC | PRN
  Filled 2015-03-04: qty 30

## 2015-03-04 MED ORDER — DIPHENHYDRAMINE HCL 50 MG/ML IJ SOLN: 12.5000 mg | INTRAMUSCULAR | Status: DC | PRN

## 2015-03-04 MED ORDER — CITRIC ACID-SODIUM CITRATE 334-500 MG/5ML PO SOLN: 30.0000 mL | ORAL | Status: DC | PRN

## 2015-03-04 MED ORDER — MISOPROSTOL 50MCG HALF TABLET
50.0000 ug | ORAL_TABLET | Freq: Once | ORAL | Status: AC
  Administered 2015-03-04: 50 ug via ORAL
  Filled 2015-03-04: qty 0.5

## 2015-03-04 MED ORDER — OXYTOCIN 40 UNITS IN LACTATED RINGERS INFUSION - SIMPLE MED
1.0000 m[IU]/min | INTRAVENOUS | Status: DC
  Administered 2015-03-04: 2 m[IU]/min via INTRAVENOUS
  Filled 2015-03-04: qty 1000

## 2015-03-04 MED ORDER — OXYCODONE-ACETAMINOPHEN 5-325 MG PO TABS: 2.0000 | ORAL_TABLET | ORAL | Status: DC | PRN

## 2015-03-04 MED ORDER — ACETAMINOPHEN 325 MG PO TABS: 650.0000 mg | ORAL_TABLET | ORAL | Status: DC | PRN

## 2015-03-04 MED ORDER — FENTANYL CITRATE (PF) 100 MCG/2ML IJ SOLN: 100.0000 ug | INTRAMUSCULAR | Status: DC | PRN

## 2015-03-04 MED ORDER — OXYCODONE-ACETAMINOPHEN 5-325 MG PO TABS: 1.0000 | ORAL_TABLET | ORAL | Status: DC | PRN

## 2015-03-04 MED ORDER — FENTANYL 2.5 MCG/ML BUPIVACAINE 1/10 % EPIDURAL INFUSION (WH - ANES)
13.0000 mL/h | INTRAMUSCULAR | Status: DC | PRN
  Administered 2015-03-04 (×2): 13 mL/h via EPIDURAL

## 2015-03-04 MED ORDER — PHENYLEPHRINE 40 MCG/ML (10ML) SYRINGE FOR IV PUSH (FOR BLOOD PRESSURE SUPPORT)
80.0000 ug | PREFILLED_SYRINGE | INTRAVENOUS | Status: DC | PRN
  Filled 2015-03-04: qty 20

## 2015-03-04 MED ORDER — EPHEDRINE 5 MG/ML INJ
10.0000 mg | INTRAVENOUS | Status: DC | PRN
  Filled 2015-03-04: qty 2

## 2015-03-04 MED ORDER — FENTANYL 2.5 MCG/ML BUPIVACAINE 1/10 % EPIDURAL INFUSION (WH - ANES)
1.0000 mL/h | INTRAMUSCULAR | Status: DC | PRN
  Filled 2015-03-04: qty 125

## 2015-03-04 MED ORDER — TERBUTALINE SULFATE 1 MG/ML IJ SOLN
0.2500 mg | Freq: Once | INTRAMUSCULAR | Status: AC | PRN
Start: 2015-03-04 — End: 2015-03-04
  Filled 2015-03-04: qty 1

## 2015-03-04 MED ORDER — PHENYLEPHRINE 40 MCG/ML (10ML) SYRINGE FOR IV PUSH (FOR BLOOD PRESSURE SUPPORT)
80.0000 ug | PREFILLED_SYRINGE | INTRAVENOUS | Status: DC | PRN
  Filled 2015-03-04: qty 2

## 2015-03-04 MED ORDER — LIDOCAINE HCL (PF) 1 % IJ SOLN
INTRAMUSCULAR | Status: DC | PRN
  Administered 2015-03-04 (×2): 4 mL

## 2015-03-04 MED ORDER — LACTATED RINGERS IV SOLN
INTRAVENOUS | Status: DC
  Administered 2015-03-04 (×2): via INTRAVENOUS

## 2015-03-04 NOTE — Progress Notes (Signed)
Patient placed on telemetry so that she can walk.

## 2015-03-04 NOTE — Anesthesia Procedure Notes (Addendum)
Epidural Patient location during procedure: OB Start time: 03/04/2015 5:46 PM  Staffing Anesthesiologist: Mal AmabileFOSTER, Deniss Wormley  Preanesthetic Checklist Completed: patient identified, site marked, surgical consent, pre-op evaluation, timeout performed, IV checked, risks and benefits discussed and monitors and equipment checked  Epidural Patient position: sitting Prep: site prepped and draped and DuraPrep Patient monitoring: continuous pulse ox and blood pressure Approach: midline Location: L4-L5 Injection technique: LOR air  Needle:  Needle type: Tuohy  Needle gauge: 17 G Needle length: 9 cm and 9 Needle insertion depth: 6 cm Catheter type: closed end flexible Catheter size: 19 Gauge Catheter at skin depth: 11 cm Test dose: negative and Other  Assessment Events: blood not aspirated, injection not painful, no injection resistance, negative IV test and no paresthesia  Additional Notes Patient identified. Risks and benefits discussed including failed block, incomplete  Pain control, post dural puncture headache, nerve damage, paralysis, blood pressure Changes, nausea, vomiting, reactions to medications-both toxic and allergic and post Partum back pain. All questions were answered. Patient expressed understanding and wished to proceed. Sterile technique was used throughout procedure. Epidural site was Dressed with sterile barrier dressing. No paresthesias, signs of intravascular injection Or signs of intrathecal spread were encountered.  Patient was more comfortable after the epidural was dosed. Please see RN's note for documentation of vital signs and FHR which are stable.

## 2015-03-04 NOTE — Anesthesia Preprocedure Evaluation (Signed)
Anesthesia Evaluation  Patient identified by MRN, date of birth, ID band Patient awake    Reviewed: Allergy & Precautions, Patient's Chart, lab work & pertinent test results  Airway Mallampati: III  TM Distance: >3 FB Neck ROM: Full    Dental no notable dental hx. (+) Teeth Intact   Pulmonary asthma , former smoker,  breath sounds clear to auscultation  Pulmonary exam normal       Cardiovascular Normal cardiovascular examRhythm:Regular Rate:Normal     Neuro/Psych Anxiety negative neurological ROS     GI/Hepatic Neg liver ROS, GERD-  ,  Endo/Other  Hypothyroidism Morbid obesity  Renal/GU negative Renal ROS  negative genitourinary   Musculoskeletal negative musculoskeletal ROS (+)   Abdominal (+) + obese,   Peds  Hematology negative hematology ROS (+)   Anesthesia Other Findings   Reproductive/Obstetrics (+) Pregnancy                             Anesthesia Physical Anesthesia Plan  ASA: III  Anesthesia Plan: Epidural   Post-op Pain Management:    Induction:   Airway Management Planned: Natural Airway  Additional Equipment:   Intra-op Plan:   Post-operative Plan:   Informed Consent: I have reviewed the patients History and Physical, chart, labs and discussed the procedure including the risks, benefits and alternatives for the proposed anesthesia with the patient or authorized representative who has indicated his/her understanding and acceptance.     Plan Discussed with: Anesthesiologist  Anesthesia Plan Comments:         Anesthesia Quick Evaluation

## 2015-03-04 NOTE — H&P (Signed)
Linda Cortez is a 34 y.o. female presenting for IOL. Maternal Medical History:  Reason for admission: Elective IOL at 39 weeks.  Multiparity.  Extreme pelvic pressure and pain.  Fetal activity: Perceived fetal activity is normal.   Last perceived fetal movement was within the past hour.    Prenatal complications: no prenatal complications Prenatal Complications - Diabetes: none.    OB History    Gravida Para Term Preterm AB TAB SAB Ectopic Multiple Living   5 3 3  0 1 0 1 0 0 3     Past Medical History  Diagnosis Date  . Thyroid disease   . Glaucoma   . Anxiety   . Bradycardia   . Asthma   . Hypothyroidism    Past Surgical History  Procedure Laterality Date  . Pilonial cyst removed     . Breast surgery      Cyst removal    Family History: family history includes Alcohol abuse in her father; Arthritis in her maternal grandmother; Asthma in her brother; COPD in her mother; Cancer in her mother; Diabetes in her maternal aunt; Heart disease in her maternal grandmother; Learning disabilities in her son; Stroke in her maternal grandfather. Social History:  reports that she quit smoking about 8 months ago. Her smoking use included Cigarettes. She has a 3.75 pack-year smoking history. She has never used smokeless tobacco. She reports that she does not drink alcohol or use illicit drugs.   Prenatal Transfer Tool  Maternal Diabetes: No Genetic Screening: Abnormal:  Results: Other: AMA Maternal Ultrasounds/Referrals: Abnormal:  Findings:   Other: Fetal Ultrasounds or other Referrals:  Referred to Materal Fetal Medicine  Maternal Substance Abuse:  No Significant Maternal Medications:  None Significant Maternal Lab Results:  None Other Comments:  None  Review of Systems  All other systems reviewed and are negative.   Dilation: 1 Effacement (%): 50 Station: -3 Exam by:: Donzetta Sprungebbie Warren, RN Blood pressure 100/67, pulse 65, temperature 98.3 F (36.8 C), temperature source Oral,  resp. rate 16, height 5\' 3"  (1.6 m), weight 226 lb (102.513 kg), last menstrual period 06/04/2014. Maternal Exam:  Abdomen: Patient reports no abdominal tenderness. Fetal presentation: vertex  Cervix: Cervix evaluated by digital exam.     Physical Exam  Nursing note and vitals reviewed. Constitutional: She appears well-developed and well-nourished.  HENT:  Head: Normocephalic and atraumatic.  Eyes: Conjunctivae are normal. Pupils are equal, round, and reactive to light.  Neck: Normal range of motion. Neck supple.  Cardiovascular: Normal rate and regular rhythm.   Respiratory: Effort normal.  GI: Soft.  Genitourinary: Vagina normal and uterus normal.    Prenatal labs: ABO, Rh: --/--/O POS (05/31 0800) Antibody: NEG (05/31 0800) Rubella: 2.07 (12/03 1729) RPR: NON REAC (02/29 1004)  HBsAg: NEGATIVE (12/03 1729)  HIV: NONREACTIVE (02/29 1004)  GBS: Negative (05/03 0000)   Assessment/Plan: 39 weeks.  Extreme pelvic pressure and pain.  Elective IOL.   Gareld Obrecht A 03/04/2015, 1:03 PM

## 2015-03-05 LAB — RPR: RPR: NONREACTIVE

## 2015-03-05 LAB — CBC
HEMATOCRIT: 32.2 % — AB (ref 36.0–46.0)
HEMOGLOBIN: 10.9 g/dL — AB (ref 12.0–15.0)
MCH: 29.4 pg (ref 26.0–34.0)
MCHC: 33.9 g/dL (ref 30.0–36.0)
MCV: 86.8 fL (ref 78.0–100.0)
Platelets: 135 10*3/uL — ABNORMAL LOW (ref 150–400)
RBC: 3.71 MIL/uL — ABNORMAL LOW (ref 3.87–5.11)
RDW: 13.7 % (ref 11.5–15.5)
WBC: 9.8 10*3/uL (ref 4.0–10.5)

## 2015-03-05 MED ORDER — TETANUS-DIPHTH-ACELL PERTUSSIS 5-2.5-18.5 LF-MCG/0.5 IM SUSP: 0.5000 mL | Freq: Once | INTRAMUSCULAR | Status: DC

## 2015-03-05 MED ORDER — WITCH HAZEL-GLYCERIN EX PADS: 1.0000 "application " | MEDICATED_PAD | CUTANEOUS | Status: DC | PRN

## 2015-03-05 MED ORDER — OXYCODONE-ACETAMINOPHEN 5-325 MG PO TABS: 2.0000 | ORAL_TABLET | ORAL | Status: DC | PRN

## 2015-03-05 MED ORDER — LEVOTHYROXINE SODIUM 200 MCG PO TABS
200.0000 ug | ORAL_TABLET | Freq: Every day | ORAL | Status: DC
  Administered 2015-03-05 – 2015-03-06 (×2): 200 ug via ORAL
  Filled 2015-03-05 (×2): qty 1

## 2015-03-05 MED ORDER — LANOLIN HYDROUS EX OINT: TOPICAL_OINTMENT | CUTANEOUS | Status: DC | PRN

## 2015-03-05 MED ORDER — DIPHENHYDRAMINE HCL 25 MG PO CAPS: 25.0000 mg | ORAL_CAPSULE | Freq: Four times a day (QID) | ORAL | Status: DC | PRN

## 2015-03-05 MED ORDER — ONDANSETRON HCL 4 MG PO TABS: 4.0000 mg | ORAL_TABLET | ORAL | Status: DC | PRN

## 2015-03-05 MED ORDER — METHYLERGONOVINE MALEATE 0.2 MG/ML IJ SOLN
0.2000 mg | INTRAMUSCULAR | Status: DC | PRN
Start: 2015-03-05 — End: 2015-03-06

## 2015-03-05 MED ORDER — ZOLPIDEM TARTRATE 5 MG PO TABS: 5.0000 mg | ORAL_TABLET | Freq: Every evening | ORAL | Status: DC | PRN

## 2015-03-05 MED ORDER — BENZOCAINE-MENTHOL 20-0.5 % EX AERO
1.0000 "application " | INHALATION_SPRAY | CUTANEOUS | Status: DC | PRN
  Filled 2015-03-05: qty 56

## 2015-03-05 MED ORDER — OXYCODONE-ACETAMINOPHEN 5-325 MG PO TABS
1.0000 | ORAL_TABLET | ORAL | Status: DC | PRN
  Administered 2015-03-05 – 2015-03-06 (×3): 1 via ORAL
  Filled 2015-03-05 (×3): qty 1

## 2015-03-05 MED ORDER — OXYTOCIN 40 UNITS IN LACTATED RINGERS INFUSION - SIMPLE MED: 62.5000 mL/h | INTRAVENOUS | Status: DC | PRN

## 2015-03-05 MED ORDER — DIBUCAINE 1 % RE OINT: 1.0000 "application " | TOPICAL_OINTMENT | RECTAL | Status: DC | PRN

## 2015-03-05 MED ORDER — SENNOSIDES-DOCUSATE SODIUM 8.6-50 MG PO TABS
2.0000 | ORAL_TABLET | ORAL | Status: DC
  Administered 2015-03-05: 2 via ORAL
  Filled 2015-03-05: qty 2

## 2015-03-05 MED ORDER — METHYLERGONOVINE MALEATE 0.2 MG PO TABS: 0.2000 mg | ORAL_TABLET | ORAL | Status: DC | PRN

## 2015-03-05 MED ORDER — SIMETHICONE 80 MG PO CHEW: 80.0000 mg | CHEWABLE_TABLET | ORAL | Status: DC | PRN

## 2015-03-05 MED ORDER — ONDANSETRON HCL 4 MG/2ML IJ SOLN: 4.0000 mg | INTRAMUSCULAR | Status: DC | PRN

## 2015-03-05 MED ORDER — PRENATAL MULTIVITAMIN CH
1.0000 | ORAL_TABLET | Freq: Every day | ORAL | Status: DC
  Administered 2015-03-05: 1 via ORAL
  Filled 2015-03-05: qty 1

## 2015-03-05 MED ORDER — MAGNESIUM HYDROXIDE 400 MG/5ML PO SUSP: 30.0000 mL | ORAL | Status: DC | PRN

## 2015-03-05 NOTE — Progress Notes (Signed)
Post Partum Day #1 Subjective: up ad lib, voiding and tolerating PO  Objective: Blood pressure 100/41, pulse 54, temperature 98 F (36.7 C), temperature source Oral, resp. rate 20, height 5\' 3"  (1.6 m), weight 226 lb (102.513 kg), last menstrual period 06/04/2014, unknown if currently breastfeeding.  Physical Exam:  General: alert, cooperative and no distress Lochia: appropriate Uterine Fundus: firm Incision: none DVT Evaluation: No evidence of DVT seen on physical exam.   Recent Labs  03/04/15 0800 03/05/15 0540  HGB 12.3 10.9*  HCT 36.0 32.2*    Assessment/Plan: Plan for discharge tomorrow, Breastfeeding, Lactation consult and Contraception desires BTL, tubal paperwork not signed in office.  Patient made aware of 30 day hold after signing BTL paperwork.     LOS: 1 day   Rachelle A Denney 03/05/2015, 9:16 AM

## 2015-03-05 NOTE — Anesthesia Postprocedure Evaluation (Signed)
  Anesthesia Post-op Note  Patient: Linda Cortez  Procedure(s) Performed: * No procedures listed *  Patient Location: Mother/Baby  Anesthesia Type:Epidural  Level of Consciousness: awake, alert , oriented and patient cooperative  Airway and Oxygen Therapy: Patient Spontanous Breathing  Post-op Pain: mild  Post-op Assessment: Patient's Cardiovascular Status Stable, Respiratory Function Stable, No headache, No backache, No residual numbness and No residual motor weakness  Post-op Vital Signs: stable  Last Vitals:  Filed Vitals:   03/05/15 0542  BP: 100/41  Pulse: 54  Temp: 36.7 C  Resp: 20    Complications: No apparent anesthesia complications

## 2015-03-06 MED ORDER — SIMETHICONE 80 MG PO CHEW: 80.0000 mg | CHEWABLE_TABLET | ORAL | Status: DC | PRN

## 2015-03-06 MED ORDER — WITCH HAZEL-GLYCERIN EX PADS: 1.0000 "application " | MEDICATED_PAD | CUTANEOUS | Status: DC | PRN

## 2015-03-06 MED ORDER — OXYCODONE-ACETAMINOPHEN 5-325 MG PO TABS: 2.0000 | ORAL_TABLET | ORAL | Status: DC | PRN

## 2015-03-06 MED ORDER — MAGNESIUM HYDROXIDE 400 MG/5ML PO SUSP: 30.0000 mL | ORAL | Status: DC | PRN

## 2015-03-06 MED ORDER — SENNOSIDES-DOCUSATE SODIUM 8.6-50 MG PO TABS: 2.0000 | ORAL_TABLET | ORAL | Status: DC

## 2015-03-06 MED ORDER — DIBUCAINE 1 % RE OINT: 1.0000 "application " | TOPICAL_OINTMENT | RECTAL | Status: DC | PRN

## 2015-03-06 MED ORDER — CITRANATAL RX 27-1 MG PO TABS: 1.0000 | ORAL_TABLET | Freq: Every day | ORAL | Status: AC

## 2015-03-06 MED ORDER — IBUPROFEN 800 MG PO TABS: 800.0000 mg | ORAL_TABLET | Freq: Three times a day (TID) | ORAL | Status: DC | PRN

## 2015-03-06 MED ORDER — LEVOTHYROXINE SODIUM 200 MCG PO TABS: 200.0000 ug | ORAL_TABLET | Freq: Every day | ORAL | Status: DC

## 2015-03-06 NOTE — Discharge Instructions (Signed)
Keep postpartum appointment as scheduled.   Postpartum Care After Vaginal Delivery After you deliver your baby, you will stay in the hospital for 24 to 72 hours, unless there were problems with the labor or delivery, or you have medical problems. While you are in the hospital, you will receive help and instructions on how to care for yourself and your baby. Your doctor will order pain medicine, in case you need it. You will have a small amount of bleeding from your vagina and should change your sanitary pad frequently. Wash your hands thoroughly with soap and water for at least 20 seconds after changing pads and using the toilet. Let the nurses know if you begin to pass blood clots or your bleeding increases. Do not flush blood clots down the toilet before having the nurse look at them, to make sure there is no placental tissue with them. If you had an intravenous, it will be removed within 24 hours, if there are no problems. The first time you get out of bed or take a shower, call the nurse to help you because you may get weak, lightheaded, or even faint. If you are breastfeeding, you may feel painful contractions of your uterus for a couple of weeks. This is normal. The contractions help your uterus get back to normal size. If you are not breastfeeding, wear a tight, binding bra and decrease your fluid intake. You may be given a medicine to dry up the milk in your breasts. Hormones should not be given to dry up the breasts, because they can cause blood clots. You will be given your normal diet, unless you have diabetes or other medical problems.  The nurses may put an ice pack on your episiotomy (surgically enlarged opening), if you have one, to reduce the pain and puffiness (swelling). On rare occasions, you may not be able to urinate and the nurse will need to empty your bladder with a catheter. If you had a postpartum tubal ligation ("tying tubes," female sterilization), it should not make your stay in the  hospital longer. You may have your baby in your room with you as much as you like, unless you or the baby has a problem. Use the bassinet (basket) for the baby when going to and from the nursery. Do not carry the baby. Do not leave the postpartum area. If the mother is Rh negative (lacks a protein on the red blood cells) and the baby is Rh positive, the mother should get a Rho-gam shot to prevent Rh problems with future pregnancies. You may be given written instructions for you and your baby, and necessary medicines, when you are discharged from the hospital. Be sure you understand and follow the instructions as advised. HOME CARE INSTRUCTIONS AFTER YOUR DELIVERY:  Follow instructions and take the medicines given to you.   Only take over-the-counter or prescription medicines for pain, discomfort, or fever as directed by your caregiver.   Do not take aspirin, because it can cause bleeding.   Increase your activities a little bit every day to build up your strength and endurance.   Do not drink alcohol, especially if you are breastfeeding or taking pain medicine.   Take your temperature twice a day and record it.   You may have a small amount of bleeding or spotting for 2 to 4 weeks. This is normal.   Do not use tampons or douche. Use sanitary pads.   Try to have someone stay and help you for a few  days when you go home.   Try to rest or take a nap when the baby is sleeping.   If you are breastfeeding, wear a good support bra. If you are not breastfeeding, wear a tight bra, do not stimulate your nipples, and decrease your fluid intake.   Eat a healthy, nutritious diet and continue to take your prenatal vitamins.   Do not drive, do any heavy activities or travel until your caregiver tells you it is OK.   Do not have intercourse until your caregiver gives you permission to do so.   Ask your caregiver when you can begin to exercise and what type of exercises to do.   Call your caregiver  if you think you are having a problem from your delivery.   Call your pediatrician if you are having a problem with the baby.   Schedule your postpartum visit and keep it.  SEEK MEDICAL CARE IF:  You have a temperature of 100 F (37.8 C) or higher.   You have increased vaginal bleeding or are passing clots. Save any clots to show your caregiver.   You have bloody urine, or pain when you urinate.   You have a bad smelling vaginal discharge.   You have increasing pain or swelling on your episiotomy (surgically enlarged opening).   You develop a severe headache.   You feel depressed.   The episiotomy is separating.   You become dizzy or lightheaded.   You develop a rash.   You have a reaction or problems with your medicine.   You have pain, redness, and/or swelling at the intravenous site.  SEEK IMMEDIATE MEDICAL CARE IF:  You have chest pain.   You develop shortness of breath.   You pass out.   You develop pain, with or without swelling or redness in your leg.   You develop heavy vaginal bleeding, with or without blood clots.   You develop stomach pain.   You develop a bad smelling vaginal discharge.  MAKE SURE YOU:   Understand these instructions.   Will watch your condition.   Will get help right away if you are not doing well or get worse.  Document Released: 07/18/2007 Document Re-Released: 09/08/2009 [facility] Patient Information 2011 [place], [place].

## 2015-03-06 NOTE — Discharge Summary (Signed)
Obstetric Discharge Summary Reason for Admission: induction of labor Prenatal Procedures: none Intrapartum Procedures: spontaneous vaginal delivery Postpartum Procedures: none Complications-Operative and Postpartum: none HEMOGLOBIN  Date Value Ref Range Status  03/05/2015 10.9* 12.0 - 15.0 g/dL Final   HCT  Date Value Ref Range Status  03/05/2015 32.2* 36.0 - 46.0 % Final    Physical Exam:  General: alert, cooperative and no distress Lochia: appropriate Uterine Fundus: firm Incision: none DVT Evaluation: No evidence of DVT seen on physical exam.  Discharge Diagnoses: Term Pregnancy-delivered  Discharge Information: Date: 03/06/2015 Activity: pelvic rest Diet: routine Medications: PNV, Ibuprofen, Colace, Iron and Percocet Condition: stable Instructions: refer to practice specific booklet Discharge to: home   Newborn Data: Live born female  Birth Weight: 7 lb 13.8 oz (3565 g) APGAR: 8, 9  Home with mother.  Linda Cortez 03/06/2015, 7:14 AM

## 2015-03-07 ENCOUNTER — Other Ambulatory Visit: Payer: Self-pay | Admitting: Certified Nurse Midwife

## 2015-03-07 DIAGNOSIS — O43109 Malformation of placenta, unspecified, unspecified trimester: Secondary | ICD-10-CM

## 2015-03-11 ENCOUNTER — Encounter: Payer: Medicaid Other | Admitting: Obstetrics

## 2015-03-18 ENCOUNTER — Encounter: Payer: Self-pay | Admitting: Obstetrics and Gynecology

## 2015-03-18 ENCOUNTER — Ambulatory Visit: Payer: Self-pay | Admitting: Obstetrics

## 2015-03-18 ENCOUNTER — Ambulatory Visit: Payer: Medicaid Other | Admitting: Obstetrics

## 2015-03-18 ENCOUNTER — Ambulatory Visit (INDEPENDENT_AMBULATORY_CARE_PROVIDER_SITE_OTHER): Payer: Medicaid Other | Admitting: Certified Nurse Midwife

## 2015-03-18 VITALS — BP 101/62 | HR 62 | Temp 97.9°F | Wt 209.0 lb

## 2015-03-18 DIAGNOSIS — Z3009 Encounter for other general counseling and advice on contraception: Secondary | ICD-10-CM

## 2015-03-18 DIAGNOSIS — Z309 Encounter for contraceptive management, unspecified: Secondary | ICD-10-CM

## 2015-03-18 NOTE — Progress Notes (Signed)
Patient ID: Linda Cortez, female   DOB: 01-31-1981, 34 y.o.   MRN: [id number]  Subjective:     Linda Cortez is a 34 y.o. female who presents for a postpartum visit. She is 2 weeks postpartum following a spontaneous vaginal delivery. I have fully reviewed the prenatal and intrapartum course. The delivery was at 39 gestational weeks. Outcome: spontaneous vaginal delivery. Anesthesia: epidural. Postpartum course has been normal. Baby's course has been normal. Baby is feeding by bottle - Similac Advance. Bleeding thin lochia and pink. Bowel function is normal. Bladder function is normal. Patient is not sexually active. Contraception method is abstinence. Postpartum depression screening: negative.  Tobacco, alcohol and substance abuse history reviewed.  Adult immunizations reviewed including TDAP, rubella and varicella.  The following portions of the patient's history were reviewed and updated as appropriate: allergies, current medications, past family history, past medical history, past social history, past surgical history and problem list.  Review of Systems A comprehensive review of systems was negative.   Objective:    BP 101/62 mmHg  Pulse 62  Temp(Src) 97.9 F (36.6 C)  Wt 209 lb (94.802 kg)  Breastfeeding? No  General:  alert, cooperative and no distress   Breasts:  negative  Lungs: clear to auscultation bilaterally  Heart:  regular rate and rhythm, S1, S2 normal, no murmur, click, rub or gallop  Abdomen: soft, non-tender; bowel sounds normal; no masses,  no organomegaly   Vulva:  not evaluated  Vagina: not evaluated  Cervix:  not evualuted  Corpus: not examined  Adnexa:  not evaluated  Rectal Exam: Not performed.          90% of 15 min visit spent on counseling and coordination of care.  Assessment:     normal 2 week postpartum exam. Pap smear not done at today's visit.  Plan:    1. Contraception: abstinence 2. Desires tubal ligation referred to faculty practice for BTL 3.  Follow up in: 4 weeks or as needed.   Preconception counseling provided Healthy lifestyle practices reviewed

## 2015-03-19 ENCOUNTER — Telehealth: Payer: Self-pay

## 2015-03-19 NOTE — Telephone Encounter (Signed)
PATIENT HAS APPT ON 04/16/15 FOR TUBAL LIGATION AT [facility], SHE IS SUPPOSED TO SIGN THE PAPERS WITH THEM - LEFT MESSAGE FOR HER TO SIGN PAPERS WITH THEM - TOLD HER TO CALL us BACK AS WELL

## 2015-04-08 ENCOUNTER — Other Ambulatory Visit: Payer: Self-pay | Admitting: Certified Nurse Midwife

## 2015-04-09 ENCOUNTER — Other Ambulatory Visit: Payer: Self-pay | Admitting: Certified Nurse Midwife

## 2015-04-09 DIAGNOSIS — N946 Dysmenorrhea, unspecified: Secondary | ICD-10-CM

## 2015-04-09 MED ORDER — CYCLOBENZAPRINE HCL 5 MG PO TABS: 5.0000 mg | ORAL_TABLET | Freq: Three times a day (TID) | ORAL | Status: DC | PRN

## 2015-04-09 MED ORDER — IBUPROFEN 800 MG PO TABS: 800.0000 mg | ORAL_TABLET | Freq: Three times a day (TID) | ORAL | Status: DC | PRN

## 2015-04-09 MED ORDER — APAP-PAMABROM-PYRILAMINE 500-25-15 MG PO TABS: 2.0000 | ORAL_TABLET | Freq: Four times a day (QID) | ORAL | Status: DC | PRN

## 2015-04-15 ENCOUNTER — Ambulatory Visit (INDEPENDENT_AMBULATORY_CARE_PROVIDER_SITE_OTHER): Payer: Medicaid Other | Admitting: Certified Nurse Midwife

## 2015-04-15 ENCOUNTER — Encounter: Payer: Self-pay | Admitting: Certified Nurse Midwife

## 2015-04-15 VITALS — BP 107/70 | HR 67 | Temp 96.4°F | Ht 63.0 in | Wt 210.4 lb

## 2015-04-15 DIAGNOSIS — T8131XD Disruption of external operation (surgical) wound, not elsewhere classified, subsequent encounter: Secondary | ICD-10-CM

## 2015-04-16 ENCOUNTER — Encounter: Payer: Medicaid Other | Admitting: Obstetrics and Gynecology

## 2015-04-16 NOTE — Progress Notes (Addendum)
Patient ID: Linda Hatchetracy Hribar, female   DOB: 09/03/1981, 34 y.o.   MRN: [id number]  Subjective:     Linda Cortez is a 34 y.o. female who presents for a postpartum visit. She is 6 weeks postpartum following a spontaneous vaginal delivery. I have fully reviewed the prenatal and intrapartum course. The delivery was at 39 gestational weeks. Outcome: spontaneous vaginal delivery. Anesthesia: epidural. Postpartum course has been normal. Baby's course has been normal. Baby is feeding by bottle - Similac Advance. Bleeding no bleeding. Bowel function is normal. Bladder function is normal. Patient is not sexually active. Contraception method is abstinence and condoms. Postpartum depression screening: negative.  Tobacco, alcohol and substance abuse history reviewed.  Adult immunizations reviewed including TDAP, rubella and varicella.  The following portions of the patient's history were reviewed and updated as appropriate: allergies, current medications, past family history, past medical history, past social history, past surgical history and problem list.  Review of Systems A comprehensive review of systems was negative.   Objective:    BP 107/70 mmHg  Pulse 67  Temp(Src) 96.4 F (35.8 C)  Ht 5\' 3"  (1.6 m)  Wt 210 lb 6.4 oz (95.437 kg)  BMI 37.28 kg/m2  LMP 04/07/2015 (Exact Date)  General:  alert, cooperative and no distress   Breasts:  inspection negative, no nipple discharge or bleeding, no masses or nodularity palpable  Lungs: clear to auscultation bilaterally  Heart:  regular rate and rhythm, S1, S2 normal, no murmur, click, rub or gallop  Abdomen: soft, non-tender; bowel sounds normal; no masses,  no organomegaly   Vulva:  not evaluated  Vagina: not evaluated  Cervix:  not evaluated  Corpus: normal  Adnexa:  not evaluated  Rectal Exam: Not performed.          100% of 15 min visit spent on counseling and coordination of care.  Assessment:     normal postpartum exam. Pap smear not done at  today's visit.   Contraception counseling  Plan:    1. Contraception: was planning for BTL after discussion decided on Mirena IUD.  2. Follow up in: a few weeks or as needed for Mirena IUD and pap smear.  2hr GTT for h/o GDM/screening for DM q 3 yrs per ADA recommendations Preconception counseling provided Healthy lifestyle practices reviewed

## 2015-04-28 ENCOUNTER — Telehealth: Payer: Self-pay | Admitting: *Deleted

## 2015-04-28 NOTE — Telephone Encounter (Signed)
Patient is interested in a Mirena IUD for contraception. Per Orvilla Cornwall, CNM patient to be scheduled approximately 3 weeks from her last visit. Attempted to contact the patient and left message with patient's husband for patient to call the office.

## 2015-05-05 NOTE — Telephone Encounter (Signed)
Patient returned call to the office 05-05-15 @ 11:11 am. Attempted to contact patient @ 1:31pm, left message for patient to call the office.

## 2015-05-06 NOTE — Telephone Encounter (Signed)
Spoke with patient. Patient is currently sexually active with condom use. Patient states she last had intercourse a week ago. Patient has been scheduled for a Mirena IUD insertion on 05-08-15 @ 11 am .

## 2015-05-08 ENCOUNTER — Encounter: Payer: Self-pay | Admitting: Certified Nurse Midwife

## 2015-05-08 ENCOUNTER — Ambulatory Visit (INDEPENDENT_AMBULATORY_CARE_PROVIDER_SITE_OTHER): Payer: Medicaid Other | Admitting: Certified Nurse Midwife

## 2015-05-08 VITALS — BP 103/64 | HR 66 | Temp 98.0°F | Ht 63.0 in | Wt 210.8 lb

## 2015-05-08 DIAGNOSIS — Z3202 Encounter for pregnancy test, result negative: Secondary | ICD-10-CM

## 2015-05-08 DIAGNOSIS — Z30014 Encounter for initial prescription of intrauterine contraceptive device: Secondary | ICD-10-CM | POA: Diagnosis not present

## 2015-05-08 DIAGNOSIS — Z01812 Encounter for preprocedural laboratory examination: Secondary | ICD-10-CM

## 2015-05-08 LAB — POCT URINE PREGNANCY: Preg Test, Ur: NEGATIVE

## 2015-05-08 NOTE — Progress Notes (Signed)
Patient ID: Linda Cortez, female   DOB: 1981-01-08, 34 y.o.   MRN: [id number]  IUD Procedure Note   DIAGNOSIS: Desires long-term, reversible contraception   PROCEDURE: IUD placement Performing Provider: Orvilla Cornwall CNM  Patient counseled prior to procedure. I explained risks and benefits of Mirena IUD, reviewed alternative forms of contraception. Patient stated understanding and consented to continue with procedure.   LMP: July 8th, Postpartum Pregnancy Test: Negative Lot #: [id number]  Expiration Date: 12/18   IUD type: [ X ] Mirena   [   ] Paraguard    PROCEDURE:  Timeout procedure was performed to ensure right patient and right site.  A bimanual exam was performed to determine the position of the uterus, retroverted. The speculum was placed. The vagina and cervix was sterilized in the usual manner and sterile technique was maintained throughout the course of the procedure. A single toothed tenaculum was not required. The depth of the uterus was sounded to 9 cm. The IUD was inserted to the appropriate depth and inserted without difficulty.  The string was cut to an estimated 4 cm length. Bleeding was minimal. The patient tolerated the procedure well.   Follow up: The patient tolerated the procedure well without complications.  Standard post-procedure care is explained and return precautions are given.  Orvilla Cornwall CNM

## 2015-05-11 LAB — SURESWAB, VAGINOSIS/VAGINITIS PLUS
ATOPOBIUM VAGINAE: NOT DETECTED Log (cells/mL)
C. ALBICANS, DNA: NOT DETECTED
C. TRACHOMATIS RNA, TMA: NOT DETECTED
C. glabrata, DNA: NOT DETECTED
C. parapsilosis, DNA: NOT DETECTED
C. tropicalis, DNA: NOT DETECTED
GARDNERELLA VAGINALIS: NOT DETECTED Log (cells/mL)
LACTOBACILLUS SPECIES: NOT DETECTED Log (cells/mL)
MEGASPHAERA SPECIES: NOT DETECTED Log (cells/mL)
N. gonorrhoeae RNA, TMA: NOT DETECTED
T. vaginalis RNA, QL TMA: NOT DETECTED

## 2015-05-28 ENCOUNTER — Other Ambulatory Visit: Payer: Self-pay | Admitting: Certified Nurse Midwife

## 2015-05-29 ENCOUNTER — Encounter: Payer: Self-pay | Admitting: Certified Nurse Midwife

## 2015-05-29 ENCOUNTER — Ambulatory Visit (INDEPENDENT_AMBULATORY_CARE_PROVIDER_SITE_OTHER): Payer: Medicaid Other | Admitting: Certified Nurse Midwife

## 2015-05-29 VITALS — BP 124/84 | HR 68 | Temp 98.4°F | Wt 212.2 lb

## 2015-05-29 DIAGNOSIS — Z30432 Encounter for removal of intrauterine contraceptive device: Secondary | ICD-10-CM | POA: Diagnosis not present

## 2015-05-29 DIAGNOSIS — Z3009 Encounter for other general counseling and advice on contraception: Secondary | ICD-10-CM | POA: Diagnosis not present

## 2015-05-29 DIAGNOSIS — Z30013 Encounter for initial prescription of injectable contraceptive: Secondary | ICD-10-CM

## 2015-05-29 DIAGNOSIS — N938 Other specified abnormal uterine and vaginal bleeding: Secondary | ICD-10-CM

## 2015-05-29 DIAGNOSIS — M545 Low back pain: Secondary | ICD-10-CM | POA: Diagnosis not present

## 2015-05-29 DIAGNOSIS — R102 Pelvic and perineal pain: Secondary | ICD-10-CM | POA: Diagnosis not present

## 2015-05-29 MED ORDER — MEDROXYPROGESTERONE ACETATE 150 MG/ML IM SUSP: 150.0000 mg | INTRAMUSCULAR | Status: DC

## 2015-05-29 NOTE — Progress Notes (Signed)
Patient ID: Linda Cortez, female   DOB: 02-23-1981, 34 y.o.   MRN: [id number]   Subjective:    Linda Cortez is a 34 y.o. 33 y.o. female who presents for contraception counseling. The patient has no complaints today. The patient is not currently sexually active. Pertinent past medical history: current smoker.  Patient desires removal of Mirena IUD after a few weeks of use d/t spotting, denies any heavy bleeding and pelvic/back pain.  Had threatned to remove the device herself. Educated on contraception options and that we would not be putting in another device.  Patient stated that she is done having children.    The information documented in the HPI was reviewed and verified.  Menstrual History: OB History    Gravida Para Term Preterm AB TAB SAB Ectopic Multiple Living   5 4 4  0 1 0 1 0 0 2      Menarche age: unknown  No LMP recorded.   Patient Active Problem List   Diagnosis Date Noted  . Abnormalities of placenta 03/07/2015  . NSVD (normal spontaneous vaginal delivery) 03/05/2015  . Thyroid activity decreased   . Vitamin D deficiency 09/10/2014  . Asthma 09/05/2014  . Hypothyroidism 09/05/2014   Past Medical History  Diagnosis Date  . Thyroid disease   . Glaucoma   . Anxiety   . Bradycardia   . Asthma   . Hypothyroidism     Past Surgical History  Procedure Laterality Date  . Pilonial cyst removed     . Breast surgery      Cyst removal      Current outpatient prescriptions:  .  calcium carbonate (TUMS - DOSED IN MG ELEMENTAL CALCIUM) 500 MG chewable tablet, Chew 1 tablet by mouth 2 (two) times daily as needed for indigestion or heartburn. , Disp: , Rfl:  .  ibuprofen (ADVIL,MOTRIN) 800 MG tablet, Take 1 tablet (800 mg total) by mouth every 8 (eight) hours as needed., Disp: 30 tablet, Rfl: 0 .  levothyroxine (SYNTHROID, LEVOTHROID) 200 MCG tablet, Take 200 mcg by mouth daily before breakfast., Disp: , Rfl:  .  levothyroxine (SYNTHROID, LEVOTHROID) 200 MCG tablet, Take 1 tablet  (200 mcg total) by mouth daily before breakfast., Disp: 30 tablet, Rfl: 12 .  APAP-Pamabrom-Pyrilamine 500-25-15 MG TABS, Take 2 tablets by mouth 4 (four) times daily as needed. (Patient not taking: Reported on 04/15/2015), Disp: 45 each, Rfl: 3 .  cyclobenzaprine (FLEXERIL) 5 MG tablet, Take 1 tablet (5 mg total) by mouth 3 (three) times daily as needed for muscle spasms. (Patient not taking: Reported on 04/15/2015), Disp: 30 tablet, Rfl: 0 .  dibucaine (NUPERCAINAL) 1 % OINT, Place 1 application rectally as needed for hemorrhoids. (Patient not taking: Reported on 03/18/2015), Disp: 56.7 g, Rfl: 0 .  magnesium hydroxide (MILK OF MAGNESIA) 400 MG/5ML suspension, Take 30 mLs by mouth every three (3) days as needed for mild constipation (if no BM). (Patient not taking: Reported on 03/18/2015), Disp: 360 mL, Rfl: 0 .  medroxyPROGESTERone (DEPO-PROVERA) 150 MG/ML injection, Inject 1 mL (150 mg total) into the muscle every 3 (three) months., Disp: 1 mL, Rfl: 4 .  ondansetron (ZOFRAN) 8 MG tablet, Take 1 tablet (8 mg total) by mouth every 8 (eight) hours as needed for nausea or vomiting. (Patient not taking: Reported on 03/18/2015), Disp: 30 tablet, Rfl: 0 .  oxyCODONE-acetaminophen (PERCOCET/ROXICET) 5-325 MG per tablet, Take 2 tablets by mouth every 4 (four) hours as needed (for pain scale greater than 7). (Patient not taking: Reported on  03/18/2015), Disp: 50 tablet, Rfl: 0 .  Prenat w/o A-FeCb-FeGl-DSS-FA (CITRANATAL RX) 27-1 MG TABS, Take 1 tablet by mouth daily. (Patient not taking: Reported on 03/18/2015), Disp: 30 each, Rfl: 12 .  senna-docusate (SENOKOT-S) 8.6-50 MG per tablet, Take 2 tablets by mouth daily. (Patient not taking: Reported on 03/18/2015), Disp: 60 tablet, Rfl: 2 .  sertraline (ZOLOFT) 50 MG tablet, Take 1 tablet (50 mg total) by mouth daily. (Patient not taking: Reported on 05/08/2015), Disp: 30 tablet, Rfl: 1 .  simethicone (MYLICON) 80 MG chewable tablet, Chew 1 tablet (80 mg total) by mouth as  needed for flatulence. (Patient not taking: Reported on 03/18/2015), Disp: 30 tablet, Rfl: 0 .  witch hazel-glycerin (TUCKS) pad, Apply 1 application topically as needed for hemorrhoids. (Patient not taking: Reported on 03/18/2015), Disp: 40 each, Rfl: 12 Allergies  Allergen Reactions  . Augmentin [Amoxicillin-Pot Clavulanate] Rash  . Penicillins Rash    Social History  Substance Use Topics  . Smoking status: Former Smoker -- 0.25 packs/day for 15 years    Types: Cigarettes    Quit date: 07/02/2014  . Smokeless tobacco: Never Used  . Alcohol Use: No    Family History  Problem Relation Age of Onset  . Cancer Mother   . COPD Mother   . Alcohol abuse Father   . Asthma Brother   . Learning disabilities Son   . Diabetes Maternal Aunt   . Arthritis Maternal Grandmother   . Heart disease Maternal Grandmother   . Stroke Maternal Grandfather        Review of Systems Constitutional: negative for weight loss Genitourinary:negative for abnormal menstrual periods and vaginal discharge   Objective:   BP 124/84 mmHg  Pulse 68  Temp(Src) 98.4 F (36.9 C)  Wt 212 lb 3.2 oz (96.253 kg)   General:   alert  Skin:   no rash or abnormalities  Lungs:   clear to auscultation bilaterally  Heart:   regular rate and rhythm, S1, S2 normal, no murmur, click, rub or gallop  Breasts:   normal without suspicious masses, skin or nipple changes or axillary nodes  Abdomen:  normal findings: no organomegaly, soft, non-tender and no hernia  Pelvis:  External genitalia: normal general appearance Urinary system: urethral meatus normal and bladder without fullness, nontender Vaginal: normal without tenderness, induration or masses Cervix: normal appearance Adnexa: normal bimanual exam Uterus: anteverted and non-tender, normal size  IUD removed intact, patient tolerated the procedure well.    Lab Review Urine pregnancy test Labs reviewed yes Radiologic studies reviewed no  50% of 30 min visit  spent on counseling and coordination of care.   Assessment:    34 y.o., discontinuing IUD, and starting Depo-Injections no contraindications.   Plan:    All questions answered.  Meds ordered this encounter  Medications  . medroxyPROGESTERone (DEPO-PROVERA) 150 MG/ML injection    Sig: Inject 1 mL (150 mg total) into the muscle every 3 (three) months.    Dispense:  1 mL    Refill:  4   No orders of the defined types were placed in this encounter.    Follow up as needed.

## 2015-05-30 ENCOUNTER — Ambulatory Visit (INDEPENDENT_AMBULATORY_CARE_PROVIDER_SITE_OTHER): Payer: Medicaid Other | Admitting: *Deleted

## 2015-05-30 VITALS — BP 103/63 | HR 75 | Temp 98.3°F | Wt 214.0 lb

## 2015-05-30 DIAGNOSIS — Z3042 Encounter for surveillance of injectable contraceptive: Secondary | ICD-10-CM | POA: Diagnosis not present

## 2015-05-30 MED ORDER — MEDROXYPROGESTERONE ACETATE 150 MG/ML IM SUSP
150.0000 mg | INTRAMUSCULAR | Status: AC
  Administered 2015-05-30 – 2015-08-21 (×2): 150 mg via INTRAMUSCULAR

## 2015-05-30 NOTE — Progress Notes (Signed)
Pt was seen in the office today to receive her depo injection. Pt. Received injection in the left arm. Pt. Took injection well and was advised of her next due date. Lot # [id number] Exp. [id number]  Due on August 21, 2015. Injection was prepared and given by Blenda Mounts.

## 2015-06-12 ENCOUNTER — Ambulatory Visit: Payer: Medicaid Other | Admitting: Certified Nurse Midwife

## 2015-08-21 ENCOUNTER — Ambulatory Visit (INDEPENDENT_AMBULATORY_CARE_PROVIDER_SITE_OTHER): Payer: Medicaid Other | Admitting: *Deleted

## 2015-08-21 VITALS — BP 118/69 | HR 57 | Wt 214.0 lb

## 2015-08-21 DIAGNOSIS — Z3042 Encounter for surveillance of injectable contraceptive: Secondary | ICD-10-CM

## 2015-08-21 NOTE — Progress Notes (Signed)
Pt is in office for depo injection.  Pt is on time for injection.  Pt tolerated injection well.  Pt advised to RTO on 11-11-14 for next injection.    BP 118/69 mmHg  Pulse 57  Wt 214 lb (97.07 kg)  Administrations This Visit    medroxyPROGESTERone (DEPO-PROVERA) injection 150 mg    Admin Date Action Dose Route Administered By         08/21/2015 Given 150 mg Intramuscular Lanney GinsSuzanne D Kennley Schwandt, CMA

## 2015-10-01 IMAGING — US US OB COMP LESS 14 WK
1 series · 14 of 28 positions shown · non-contrast
Comparison: None.

CLINICAL DATA: Pregnant patient with spotting. Quantitative HCG
[DATE].

EXAM:
OBSTETRIC <14 WK US AND TRANSVAGINAL OB US
TECHNIQUE: Both transabdominal and transvaginal ultrasound examinations were
performed for complete evaluation of the gestation as well as the
maternal uterus, adnexal regions, and pelvic cul-de-sac.
Transvaginal technique was performed to assess early pregnancy.

[Series 1: us ob comp less 14 wk · 0.22mm/px · 46 acquisitions, 14 frames shown]
[im 2/46]
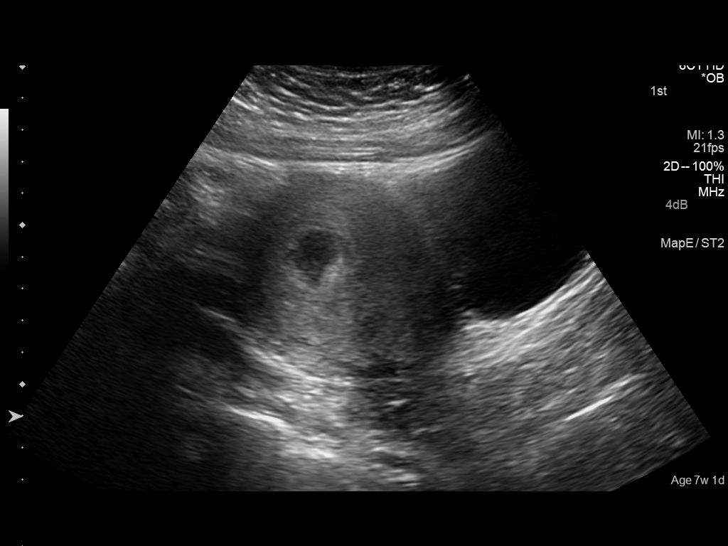
[im 6/46]
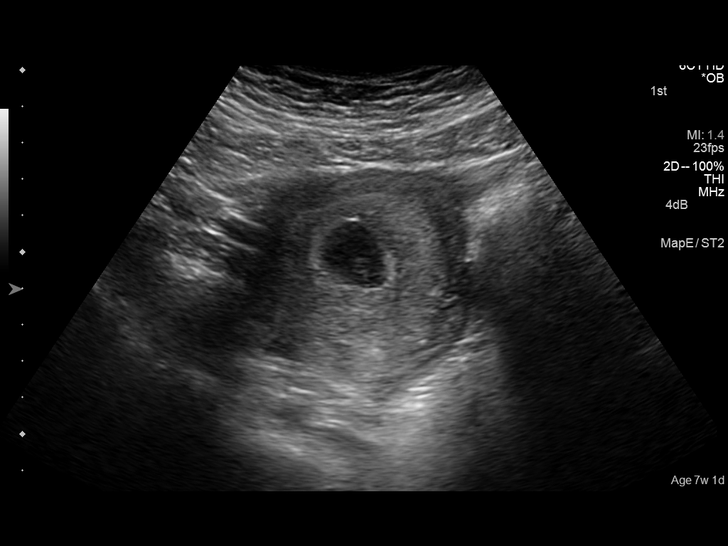
[im 9/46]
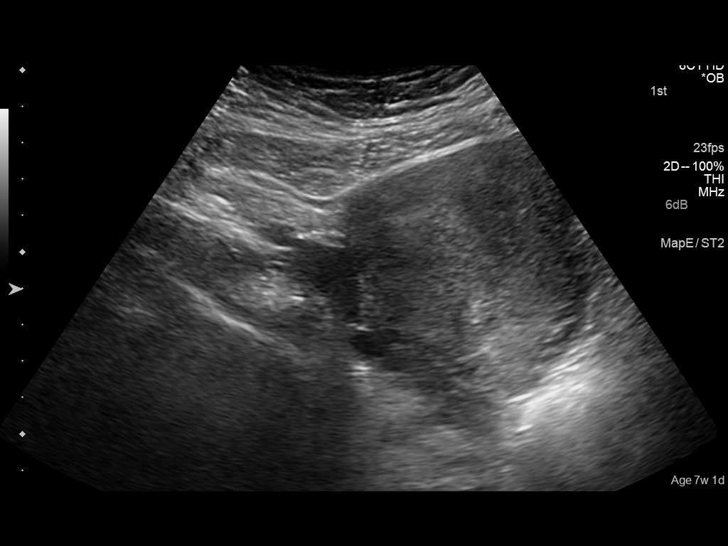
[im 12/46]
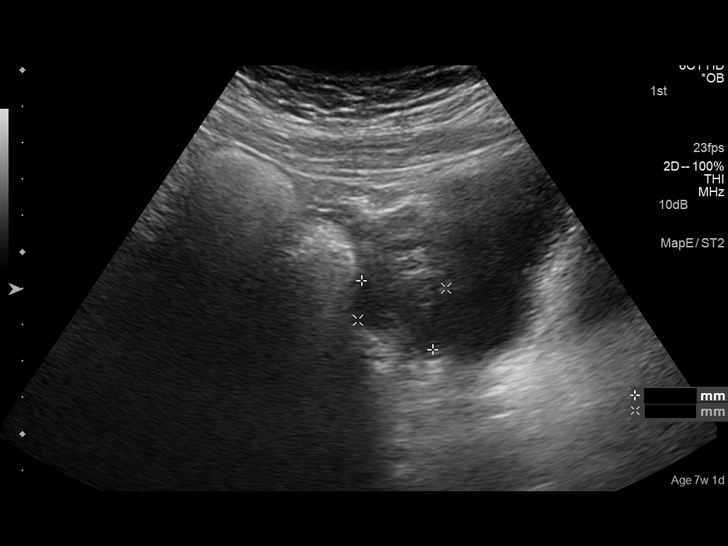
[im 16/46]
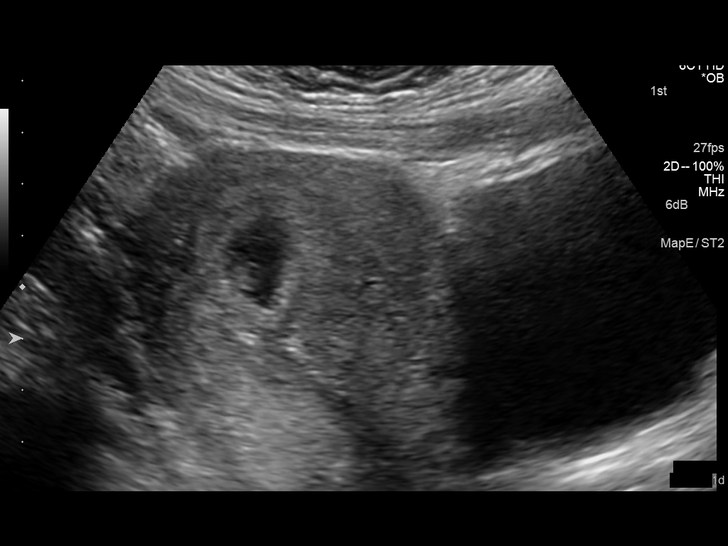
[im 19/46]
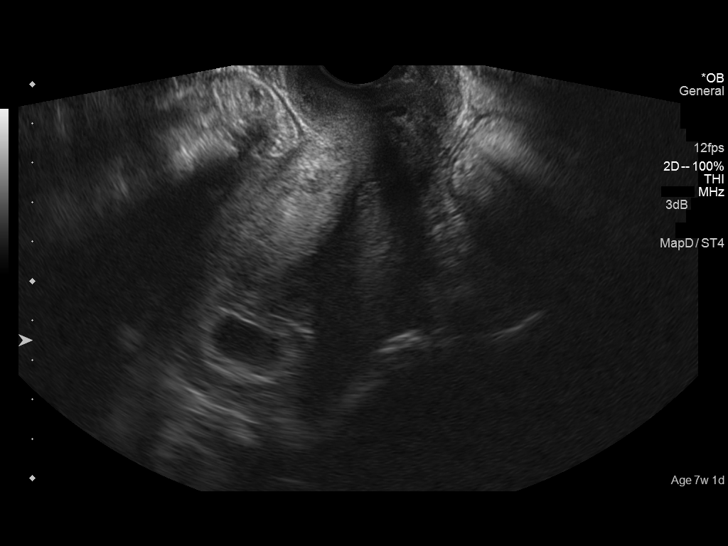
[im 22/46]
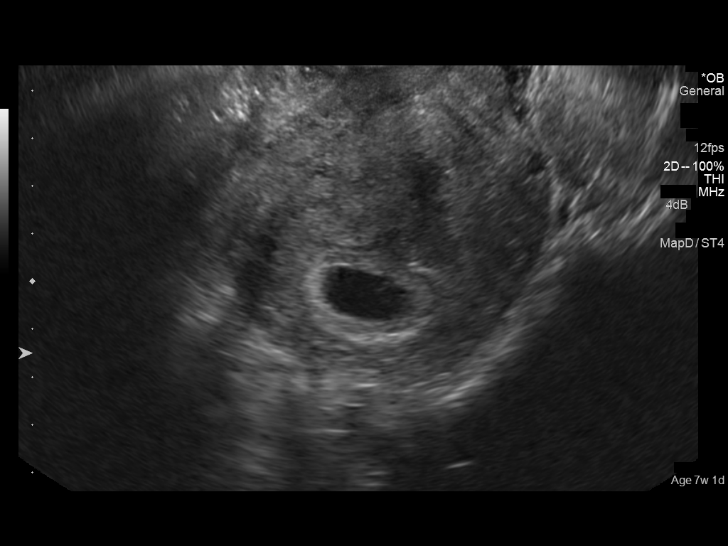
[im 26/46]
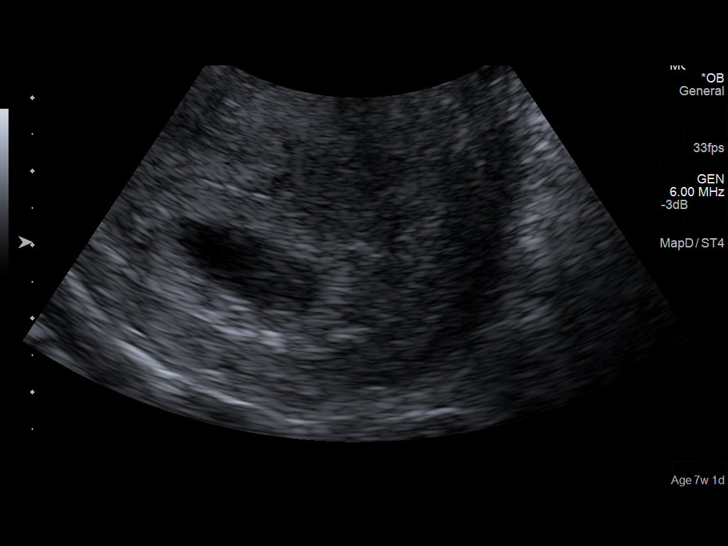
[im 29/46]
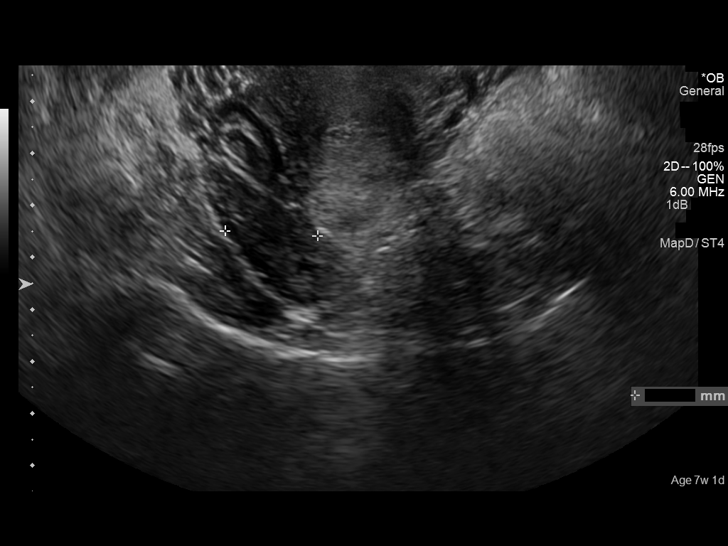
[im 32/46]
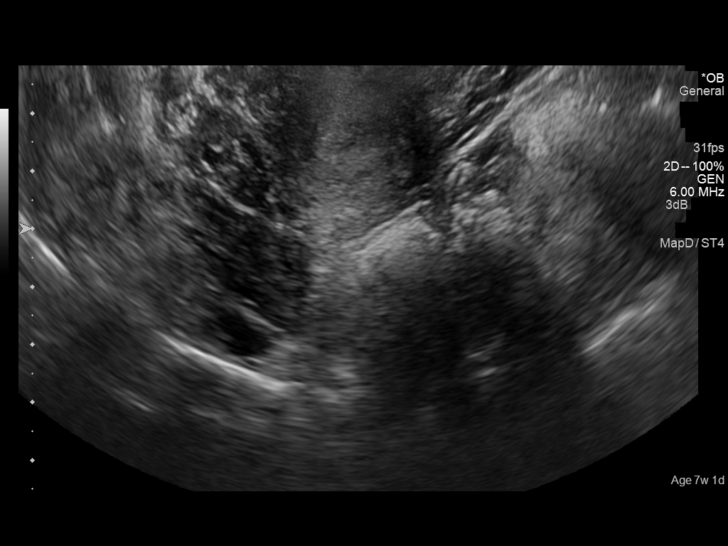
[im 36/46]
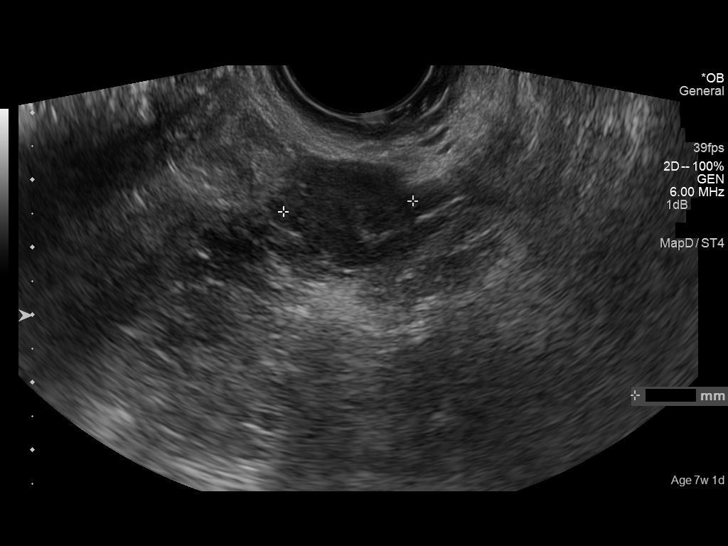
[im 39/46]
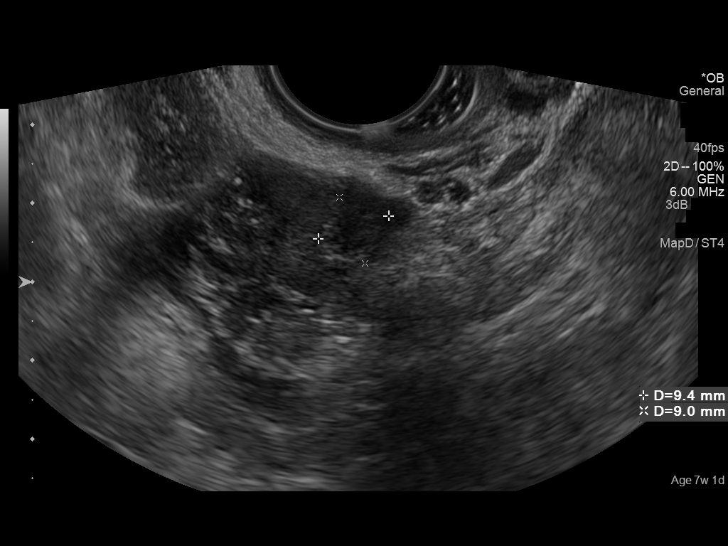
[im 42/46]
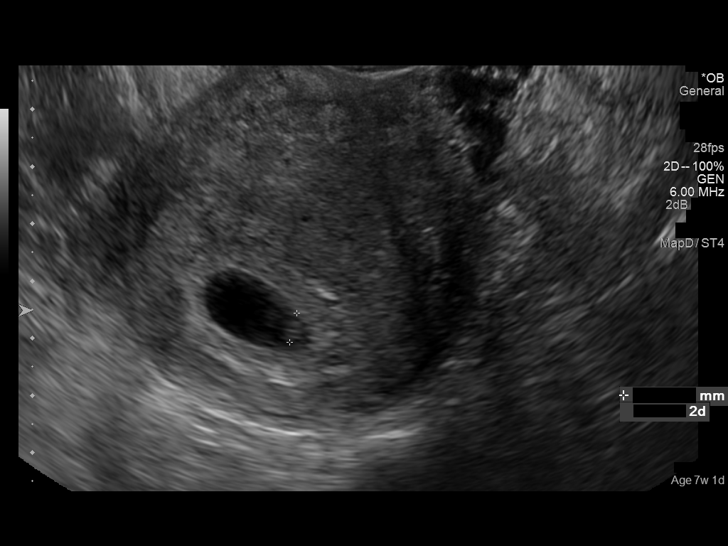
[im 46/46]
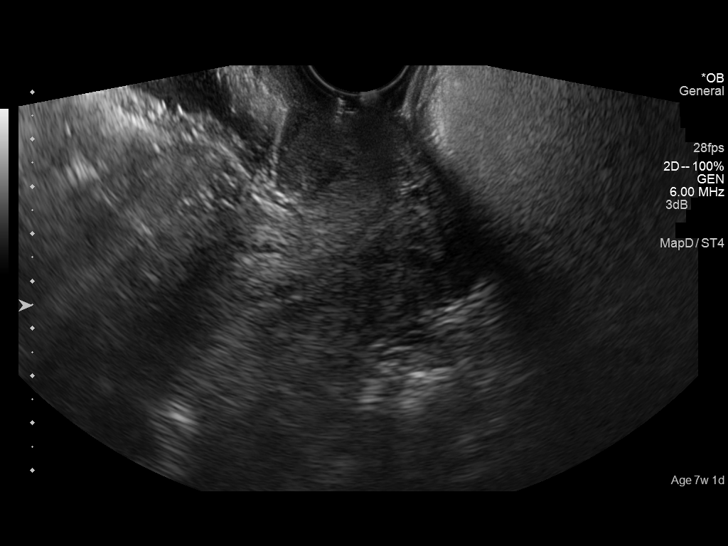

[14 of 28 positions shown; findings below may reference images not displayed]

FINDINGS: Intrauterine gestational sac: Visualized/normal in shape. No
subchorionic hemorrhage is visualized.

Yolk sac:  Visualized.

Embryo:  Visualized.

Cardiac Activity: Detected.

Heart Rate:  126 bpm

CRL:   5.2  mm   6 w 2 d                  US EDC: 03/13/2015

Maternal uterus/adnexae: Unremarkable.
IMPRESSION: Single live intrauterine pregnancy.  No acute finding.

## 2015-11-06 ENCOUNTER — Telehealth: Payer: Self-pay | Admitting: *Deleted

## 2015-11-06 ENCOUNTER — Other Ambulatory Visit: Payer: Self-pay | Admitting: Certified Nurse Midwife

## 2015-11-06 NOTE — Telephone Encounter (Signed)
Patient states she is losing her insurance and she needs to switch from the Depo to [facility].  2/2 1:41 Attempted to call patient- network difficulty . Patient called back- she will need a generic- something cheap. Reviewed use of OCP.

## 2015-11-06 NOTE — Telephone Encounter (Signed)
We can do Sprintec it is 9.00 at [facility], she [facility] chart [facility].  Can we send it to her [facility]?  Thank you.

## 2015-11-12 ENCOUNTER — Telehealth: Payer: Self-pay | Admitting: *Deleted

## 2015-11-12 ENCOUNTER — Ambulatory Visit: Payer: Medicaid Other

## 2015-11-12 ENCOUNTER — Other Ambulatory Visit: Payer: Self-pay | Admitting: *Deleted

## 2015-11-12 DIAGNOSIS — Z30011 Encounter for initial prescription of contraceptive pills: Secondary | ICD-10-CM

## 2015-11-12 MED ORDER — NORGESTIMATE-ETH ESTRADIOL 0.25-35 MG-MCG PO TABS: 1.0000 | ORAL_TABLET | Freq: Every day | ORAL | Status: DC

## 2015-11-12 NOTE — Telephone Encounter (Signed)
Patient left message that she needs her birth control- LM on VM- [facility].

## 2015-11-13 NOTE — Telephone Encounter (Signed)
error 

## 2016-06-27 ENCOUNTER — Emergency Department (HOSPITAL_COMMUNITY)
Admission: EM | Admit: 2016-06-27 | Discharge: 2016-06-28 | Disposition: A | Discharge status: Home / Self Care | Payer: Medicaid Other | Attending: Emergency Medicine | Admitting: Emergency Medicine

## 2016-06-27 ENCOUNTER — Encounter (HOSPITAL_COMMUNITY): Payer: Self-pay | Admitting: Emergency Medicine

## 2016-06-27 ENCOUNTER — Emergency Department (HOSPITAL_COMMUNITY): Payer: Medicaid Other

## 2016-06-27 DIAGNOSIS — E039 Hypothyroidism, unspecified: Secondary | ICD-10-CM | POA: Insufficient documentation

## 2016-06-27 DIAGNOSIS — R0602 Shortness of breath: Secondary | ICD-10-CM | POA: Diagnosis present

## 2016-06-27 DIAGNOSIS — Z87891 Personal history of nicotine dependence: Secondary | ICD-10-CM | POA: Insufficient documentation

## 2016-06-27 DIAGNOSIS — J45901 Unspecified asthma with (acute) exacerbation: Secondary | ICD-10-CM | POA: Insufficient documentation

## 2016-06-27 MED ORDER — ALBUTEROL SULFATE HFA 108 (90 BASE) MCG/ACT IN AERS
2.0000 | INHALATION_SPRAY | RESPIRATORY_TRACT | Status: DC | PRN
  Administered 2016-06-27: 2 via RESPIRATORY_TRACT
  Filled 2016-06-27: qty 6.7

## 2016-06-27 MED ORDER — ALBUTEROL SULFATE (2.5 MG/3ML) 0.083% IN NEBU
5.0000 mg | INHALATION_SOLUTION | Freq: Once | RESPIRATORY_TRACT | Status: AC
  Administered 2016-06-27: 5 mg via RESPIRATORY_TRACT

## 2016-06-27 MED ORDER — IPRATROPIUM-ALBUTEROL 0.5-2.5 (3) MG/3ML IN SOLN
3.0000 mL | Freq: Once | RESPIRATORY_TRACT | Status: AC
  Administered 2016-06-27: 3 mL via RESPIRATORY_TRACT
  Filled 2016-06-27: qty 3

## 2016-06-27 MED ORDER — PREDNISONE 20 MG PO TABS
60.0000 mg | ORAL_TABLET | Freq: Once | ORAL | Status: AC
  Administered 2016-06-27: 60 mg via ORAL
  Filled 2016-06-27: qty 3

## 2016-06-27 MED ORDER — PREDNISONE 10 MG PO TABS: 20.0000 mg | ORAL_TABLET | Freq: Every day | ORAL | 0 refills | Status: DC

## 2016-06-27 MED ORDER — ALBUTEROL SULFATE (2.5 MG/3ML) 0.083% IN NEBU
INHALATION_SOLUTION | RESPIRATORY_TRACT | Status: AC
  Filled 2016-06-27: qty 6

## 2016-06-27 MED ORDER — AEROCHAMBER PLUS W/MASK MISC
1.0000 | Freq: Once | Status: AC
  Administered 2016-06-27: 1
  Filled 2016-06-27: qty 1

## 2016-06-27 NOTE — ED Provider Notes (Signed)
[facility]-EMERGENCY DEPT Provider Note   CSN: [id number] Arrival date & time: 06/27/16  2132     History   Chief Complaint Chief Complaint  Patient presents with  . Shortness of Breath  . Asthma    HPI Linda Cortez is a 35 y.o. female.  This is a [address] with a history of asthma who has not had to use her inhaler in quite a while, started having URI symptoms several days ago.  This resolved, but she was left with a persistent cough and wheezing.  She's been using her son's inhaler without much relief      Past Medical History:  Diagnosis Date  . Anxiety   . Asthma   . Bradycardia   . Glaucoma   . Hypothyroidism   . Thyroid disease     Patient Active Problem List   Diagnosis Date Noted  . Abnormalities of placenta 03/07/2015  . NSVD (normal spontaneous vaginal delivery) 03/05/2015  . Thyroid activity decreased   . Vitamin D deficiency 09/10/2014  . Asthma 09/05/2014  . Hypothyroidism 09/05/2014    Past Surgical History:  Procedure Laterality Date  . BREAST SURGERY     Cyst removal   . pilonial cyst removed       OB History    Gravida Para Term Preterm AB Living   5 4 4  0 1 2   SAB TAB Ectopic Multiple Live Births   1 0 0 0 2       Home Medications    Prior to Admission medications   Medication Sig Start Date End Date Taking? Authorizing Provider  levothyroxine (SYNTHROID, LEVOTHROID) 200 MCG tablet Take 1 tablet (200 mcg total) by mouth daily before breakfast. 03/06/15  Yes Rachelle A Denney, CNM  APAP-Pamabrom-Pyrilamine 500-25-15 MG TABS Take 2 tablets by mouth 4 (four) times daily as needed. Patient not taking: Reported on 06/27/2016 04/09/15   Rachelle A Denney, CNM  cyclobenzaprine (FLEXERIL) 5 MG tablet Take 1 tablet (5 mg total) by mouth 3 (three) times daily as needed for muscle spasms. Patient not taking: Reported on 06/27/2016 04/09/15   Roe Coombsachelle A Denney, CNM  medroxyPROGESTERone (DEPO-PROVERA) 150 MG/ML injection Inject 1 mL (150 mg total) into the  muscle every 3 (three) months. Patient not taking: Reported on 06/27/2016 05/29/15   Roe Coombsachelle A Denney, CNM  norgestimate-ethinyl estradiol (ORTHO-CYCLEN,SPRINTEC,PREVIFEM) 0.25-35 MG-MCG tablet Take 1 tablet by mouth daily. Patient not taking: Reported on 06/27/2016 11/12/15   Rachelle A Denney, CNM  predniSONE (DELTASONE) 10 MG tablet Take 2 tablets (20 mg total) by mouth daily. 06/27/16   Earley FavorGail Jony Ladnier, NP  Prenat w/o A-FeCb-FeGl-DSS-FA (CITRANATAL RX) 27-1 MG TABS Take 1 tablet by mouth daily. Patient not taking: Reported on 06/27/2016 03/06/15   Roe Coombsachelle A Denney, CNM  sertraline (ZOLOFT) 50 MG tablet Take 1 tablet (50 mg total) by mouth daily. Patient not taking: Reported on 06/27/2016 11/13/14   Duane LopeJennifer I Rasch, NP    Family History Family History  Problem Relation Age of Onset  . Cancer Mother   . COPD Mother   . Alcohol abuse Father   . Asthma Brother   . Learning disabilities Son   . Diabetes Maternal Aunt   . Arthritis Maternal Grandmother   . Heart disease Maternal Grandmother   . Stroke Maternal Grandfather     Social History Social History  Substance Use Topics  . Smoking status: Former Smoker    Packs/day: 0.25    Years: 15.00    Types: Cigarettes  Quit date: 07/02/2014  . Smokeless tobacco: Never Used  . Alcohol use No     Allergies   Augmentin [amoxicillin-pot clavulanate] and Penicillins   Review of Systems Review of Systems  Constitutional: Negative for chills and fever.  Respiratory: Positive for chest tightness, shortness of breath and wheezing.   All other systems reviewed and are negative.    Physical Exam Updated Vital Signs BP 117/63 (BP Location: Left Arm)   Pulse 107   Temp 98.2 F (36.8 C) (Oral)   Resp 20   Ht 5\' 3"  (1.6 m)   Wt 88.5 kg   LMP 06/25/2016 (Exact Date)   SpO2 100%   BMI 34.54 kg/m   Physical Exam  Constitutional: She appears well-developed and well-nourished.  HENT:  Head: Normocephalic.  Eyes: Pupils are equal,  round, and reactive to light.  Neck: Normal range of motion.  Cardiovascular: Normal rate.   Pulmonary/Chest: She has wheezes. She exhibits no tenderness.  Abdominal: Soft.  Musculoskeletal: Normal range of motion.  Neurological: She is alert.  Skin: Skin is warm.  Psychiatric: She has a normal mood and affect.  Nursing note and vitals reviewed.    ED Treatments / Results  Labs (all labs ordered are listed, but only abnormal results are displayed) Labs Reviewed - No data to display  EKG  EKG Interpretation None       Radiology Dg Chest 2 View  Result Date: 06/27/2016 CLINICAL DATA:  Acute onset of dry hacking cough and shortness of breath. Initial encounter. EXAM: CHEST  2 VIEW COMPARISON:  None. FINDINGS: The lungs are well-aerated. Mild peribronchial thickening is noted. There is no evidence of focal opacification, pleural effusion or pneumothorax. The heart is normal in size; the mediastinal contour is within normal limits. No acute osseous abnormalities are seen. IMPRESSION: Mild peribronchial thickening noted.  Lungs otherwise clear. Electronically Signed   By: Roanna Raider M.D.   On: 06/27/2016 22:43    Procedures Procedures (including critical care time)  Medications Ordered in ED Medications  albuterol (PROVENTIL HFA;VENTOLIN HFA) 108 (90 Base) MCG/ACT inhaler 2 puff (not administered)  predniSONE (DELTASONE) tablet 60 mg (not administered)  aerochamber plus with mask device 1 each (not administered)  albuterol (PROVENTIL) (2.5 MG/3ML) 0.083% nebulizer solution 5 mg (5 mg Nebulization Given 06/27/16 2142)  ipratropium-albuterol (DUONEB) 0.5-2.5 (3) MG/3ML nebulizer solution 3 mL (3 mLs Nebulization Given 06/27/16 2240)     Initial Impression / Assessment and Plan / ED Course  I have reviewed the triage vital signs and the nursing notes.  Pertinent labs & imaging results that were available during my care of the patient were reviewed by me and considered in my  medical decision making (see chart for details).  Clinical Course     Patient did have one albuterol treatment at triage.  I will give albuterol, Atrovent and obtain chest x-ray Patient reexamined after albuterol, Atrovent treatment.  She is no longer wheezing.  X-ray was reviewed.  No pathology to be concern for pneumonia.  She'll be started on prednisone taper.  She'll be provided with an inhaler with instructions to use as follows 2 puffs every 4-6 hours while awake for 2 days then as needed.  She does have a primary care physician that she can follow-up with if she continues to have difficulties. Final Clinical Impressions(s) / ED Diagnoses   Final diagnoses:  Asthma exacerbation    New Prescriptions New Prescriptions   PREDNISONE (DELTASONE) 10 MG TABLET    Take  2 tablets (20 mg total) by mouth daily.     Earley Favor, NP 06/27/16 2332    Lorre Nick, MD 06/29/16 [phone]

## 2016-06-27 NOTE — ED Triage Notes (Signed)
Two days ago had onset of head cold/congestion. Feels like it has moved into chest. Coughing and SOB. Hx of asthma. Reports chest tightness 5/10.

## 2016-06-27 NOTE — Discharge Instructions (Signed)
You have been provided with an inhaler.  Please uses as follows 2 puffs every 4-6 hours while awake for the next 2 days then as needed.  You've also been given a prescription for steroids.  Please take these tablets as directed until all tablets have been consumed.  Make an appointment with your primary care physician as needed.  Return if you develop new or worsening symptoms

## 2016-10-08 ENCOUNTER — Ambulatory Visit (INDEPENDENT_AMBULATORY_CARE_PROVIDER_SITE_OTHER): Payer: Self-pay

## 2016-10-08 VITALS — BP 121/68 | HR 84 | Temp 98.5°F

## 2016-10-08 DIAGNOSIS — Z3200 Encounter for pregnancy test, result unknown: Secondary | ICD-10-CM

## 2016-10-08 DIAGNOSIS — Z3201 Encounter for pregnancy test, result positive: Secondary | ICD-10-CM

## 2016-10-08 LAB — POCT URINE PREGNANCY: Preg Test, Ur: POSITIVE — AB

## 2016-10-08 MED ORDER — PNV PRENATAL PLUS MULTIVITAMIN 27-1 MG PO TABS: 1.0000 mg | ORAL_TABLET | Freq: Every day | ORAL | 11 refills | Status: AC

## 2016-11-05 ENCOUNTER — Encounter: Payer: Medicaid Other | Admitting: Obstetrics

## 2017-03-18 ENCOUNTER — Encounter (HOSPITAL_COMMUNITY): Payer: Self-pay | Admitting: *Deleted

## 2017-03-18 ENCOUNTER — Inpatient Hospital Stay (HOSPITAL_COMMUNITY)
Admission: AD | Admit: 2017-03-18 | Discharge: 2017-03-18 | Disposition: A | Discharge status: Home / Self Care | Payer: Medicaid Other | Source: Ambulatory Visit | Attending: Obstetrics and Gynecology | Admitting: Obstetrics and Gynecology

## 2017-03-18 DIAGNOSIS — O26892 Other specified pregnancy related conditions, second trimester: Secondary | ICD-10-CM | POA: Diagnosis not present

## 2017-03-18 DIAGNOSIS — R42 Dizziness and giddiness: Secondary | ICD-10-CM | POA: Diagnosis not present

## 2017-03-18 DIAGNOSIS — Z79899 Other long term (current) drug therapy: Secondary | ICD-10-CM | POA: Insufficient documentation

## 2017-03-18 DIAGNOSIS — R51 Headache: Secondary | ICD-10-CM | POA: Diagnosis not present

## 2017-03-18 DIAGNOSIS — O99342 Other mental disorders complicating pregnancy, second trimester: Secondary | ICD-10-CM | POA: Diagnosis not present

## 2017-03-18 DIAGNOSIS — O9989 Other specified diseases and conditions complicating pregnancy, childbirth and the puerperium: Secondary | ICD-10-CM

## 2017-03-18 DIAGNOSIS — O99282 Endocrine, nutritional and metabolic diseases complicating pregnancy, second trimester: Secondary | ICD-10-CM | POA: Insufficient documentation

## 2017-03-18 DIAGNOSIS — F419 Anxiety disorder, unspecified: Secondary | ICD-10-CM | POA: Insufficient documentation

## 2017-03-18 DIAGNOSIS — E039 Hypothyroidism, unspecified: Secondary | ICD-10-CM | POA: Insufficient documentation

## 2017-03-18 DIAGNOSIS — Z880 Allergy status to penicillin: Secondary | ICD-10-CM | POA: Diagnosis not present

## 2017-03-18 DIAGNOSIS — J45909 Unspecified asthma, uncomplicated: Secondary | ICD-10-CM | POA: Diagnosis not present

## 2017-03-18 DIAGNOSIS — Z87891 Personal history of nicotine dependence: Secondary | ICD-10-CM | POA: Diagnosis not present

## 2017-03-18 DIAGNOSIS — O99512 Diseases of the respiratory system complicating pregnancy, second trimester: Secondary | ICD-10-CM | POA: Insufficient documentation

## 2017-03-18 DIAGNOSIS — R519 Headache, unspecified: Secondary | ICD-10-CM

## 2017-03-18 DIAGNOSIS — Z3A27 27 weeks gestation of pregnancy: Secondary | ICD-10-CM | POA: Insufficient documentation

## 2017-03-18 LAB — CBC
HCT: 36.6 % (ref 36.0–46.0)
Hemoglobin: 12.2 g/dL (ref 12.0–15.0)
MCH: 29.5 pg (ref 26.0–34.0)
MCHC: 33.3 g/dL (ref 30.0–36.0)
MCV: 88.4 fL (ref 78.0–100.0)
PLATELETS: 174 10*3/uL (ref 150–400)
RBC: 4.14 MIL/uL (ref 3.87–5.11)
RDW: 13.3 % (ref 11.5–15.5)
WBC: 11.4 10*3/uL — AB (ref 4.0–10.5)

## 2017-03-18 LAB — URINALYSIS, ROUTINE W REFLEX MICROSCOPIC
BILIRUBIN URINE: NEGATIVE
Glucose, UA: NEGATIVE mg/dL
HGB URINE DIPSTICK: NEGATIVE
Ketones, ur: NEGATIVE mg/dL
Leukocytes, UA: NEGATIVE
Nitrite: NEGATIVE
Protein, ur: NEGATIVE mg/dL
SPECIFIC GRAVITY, URINE: 1.015 (ref 1.005–1.030)
pH: 6 (ref 5.0–8.0)

## 2017-03-18 MED ORDER — CEFTRIAXONE SODIUM 250 MG IJ SOLR: 250.0000 mg | Freq: Once | INTRAMUSCULAR | Status: DC

## 2017-03-18 MED ORDER — IBUPROFEN 600 MG PO TABS
600.0000 mg | ORAL_TABLET | Freq: Once | ORAL | Status: AC
  Administered 2017-03-18: 600 mg via ORAL
  Filled 2017-03-18: qty 1

## 2017-03-18 NOTE — Progress Notes (Signed)
EFM reviewed by Venia CarbonJennifer Rasch NP, per NP d/c efm.

## 2017-03-18 NOTE — MAU Provider Note (Signed)
History     CSN: [id number]  Arrival date and time: 03/18/17 1530   First Provider Initiated Contact with Patient 03/18/17 1709      No chief complaint on file.  HPI   Linda Cortez is a 36 y.o. female [phone] @ [redacted]w[redacted]d here in MAU after an episode of dizziness, and fast pacing heart rate. She was standing up at the time and sat down and drank some water and it went away. She continues to feel dizziness and has a headache.  The dizziness worsens when she stands up. She denies changes in her vision or shortness of breath.   OB History    Gravida Para Term Preterm AB Living   6 4 4  0 1 2   SAB TAB Ectopic Multiple Live Births   1 0 0 0 2      Past Medical History:  Diagnosis Date  . Anxiety   . Asthma   . Bradycardia   . Glaucoma   . Hypothyroidism   . Thyroid disease     Past Surgical History:  Procedure Laterality Date  . BREAST SURGERY     Cyst removal   . pilonial cyst removed       Family History  Problem Relation Age of Onset  . Cancer Mother   . COPD Mother   . Alcohol abuse Father   . Asthma Brother   . Learning disabilities Son   . Diabetes Maternal Aunt   . Arthritis Maternal Grandmother   . Heart disease Maternal Grandmother   . Stroke Maternal Grandfather     Social History  Substance Use Topics  . Smoking status: Former Smoker    Packs/day: 0.25    Years: 15.00    Types: Cigarettes    Quit date: 07/02/2014  . Smokeless tobacco: Never Used  . Alcohol use No    Allergies:  Allergies  Allergen Reactions  . Augmentin [Amoxicillin-Pot Clavulanate] Rash  . Penicillins Rash    Has patient had a PCN reaction causing immediate rash, facial/tongue/throat swelling, SOB or lightheadedness with hypotension: Yes Has patient had a PCN reaction causing severe rash involving mucus membranes or skin necrosis: Yes Has patient had a PCN reaction that required hospitalization: No Has patient had a PCN reaction occurring within the last 10 years: Yes If  all of the above answers are "NO", then may proceed with Cephalosporin use.      Prescriptions Prior to Admission  Medication Sig Dispense Refill Last Dose  . flintstones complete (FLINTSTONES) 60 MG chewable tablet Chew 2 tablets by mouth at bedtime.   03/17/2017 at Unknown time  . levothyroxine (SYNTHROID, LEVOTHROID) 75 MCG tablet Take 225 mcg by mouth daily before breakfast.   03/18/2017 at Unknown time  . neomycin-bacitracin-polymyxin (NEOSPORIN) [phone] ointment Apply 1 application topically at bedtime as needed (for lip sore).   03/17/2017 at Unknown time  . Prenat w/o A-FeCb-FeGl-DSS-FA (CITRANATAL RX) 27-1 MG TABS Take 1 tablet by mouth daily. (Patient not taking: Reported on 06/27/2016) 30 each 12 Not Taking at Unknown time  . Prenatal Vit-Fe Fumarate-FA (PNV PRENATAL PLUS MULTIVITAMIN) 27-1 MG TABS Take 1 mg by mouth daily. (Patient not taking: Reported on 03/18/2017) 30 tablet 11 Not Taking at Unknown time   Results for orders placed or performed during the hospital encounter of 03/18/17 (from the past 48 hour(s))  Urinalysis, Routine w reflex microscopic     Status: Abnormal   Collection Time: 03/18/17  3:54 PM  Result Value Ref Range  Color, Urine YELLOW YELLOW   APPearance HAZY (A) CLEAR   Specific Gravity, Urine 1.015 1.005 - 1.030   pH 6.0 5.0 - 8.0   Glucose, UA NEGATIVE NEGATIVE mg/dL   Hgb urine dipstick NEGATIVE NEGATIVE   Bilirubin Urine NEGATIVE NEGATIVE   Ketones, ur NEGATIVE NEGATIVE mg/dL   Protein, ur NEGATIVE NEGATIVE mg/dL   Nitrite NEGATIVE NEGATIVE   Leukocytes, UA NEGATIVE NEGATIVE  CBC     Status: Abnormal   Collection Time: 03/18/17  5:23 PM  Result Value Ref Range   WBC 11.4 (H) 4.0 - 10.5 K/uL   RBC 4.14 3.87 - 5.11 MIL/uL   Hemoglobin 12.2 12.0 - 15.0 g/dL   HCT 82.936.6 56.236.0 - 13.046.0 %   MCV 88.4 78.0 - 100.0 fL   MCH 29.5 26.0 - 34.0 pg   MCHC 33.3 30.0 - 36.0 g/dL   RDW 86.513.3 78.411.5 - 69.615.5 %   Platelets 174 150 - 400 K/uL    Review of Systems   Respiratory: Negative for shortness of breath.   Neurological: Positive for dizziness, light-headedness and headaches.   Physical Exam   Blood pressure 109/67, pulse 77, temperature 98.1 F (36.7 C), temperature source Oral, resp. rate 20, weight 222 lb 8 oz (100.9 kg), last menstrual period 06/25/2016, SpO2 100 %.  Physical Exam  Constitutional: She is oriented to person, place, and time. She appears well-developed and well-nourished. No distress.  HENT:  Head: Normocephalic.  Eyes: Pupils are equal, round, and reactive to light.  Cardiovascular: Normal rate.   Respiratory: Effort normal and breath sounds normal. No respiratory distress. She has no wheezes.  Musculoskeletal: Normal range of motion.  Neurological: She is alert and oriented to person, place, and time.  Skin: Skin is warm. She is not diaphoretic.  Psychiatric: Her behavior is normal.   Fetal Tracing: Baseline: 130 bpm Variability: moderate  Accelerations: 10x10 Decelerations: variable deceleration  Toco: none  MAU Course  Procedures  None  MDM  Orthostatic vitals WNL CBC Ibuprofen 600 mg given PO  HA down from 8/10 to 2/10  Assessment and Plan   A:  1. Spell of dizziness   2. Pregnancy headache in second trimester     P:  Discharge home in stable condition Ok to use tylenol for HA Change positions slowly  Increase PO fluid intake Return to MAU if symptoms worsen  Pawan Knechtel, Harolyn RutherfordJennifer I, NP 03/18/2017 7:08 PM

## 2017-03-18 NOTE — MAU Note (Signed)
About 3, she started feeling real bad. Got dizzy, light headed.  Felt like her heart was racing, beating out of chest.  Laid down, sensation continued.  [place], [place] she wasn't sure if she could sit that long.  Right now she has a mean headache in her temples.  Heart is no longer racing.

## 2017-03-18 NOTE — Discharge Instructions (Signed)
General Headache Without Cause A headache is pain or discomfort felt around the head or neck area. The specific cause of a headache may not be found. There are many causes and types of headaches. A few common ones are:  Tension headaches.  Migraine headaches.  Cluster headaches.  Chronic daily headaches.  Follow these instructions at home: Watch your condition for any changes. Take these steps to help with your condition: Managing pain  Take over-the-counter and prescription medicines only as told by your health care provider.  Lie down in a dark, quiet room when you have a headache.  If directed, apply ice to the head and neck area: ? Put ice in a plastic bag. ? Place a towel between your skin and the bag. ? Leave the ice on for 20 minutes, 2-3 times per day.  Use a heating pad or hot shower to apply heat to the head and neck area as told by your health care provider.  Keep lights dim if bright lights bother you or make your headaches worse. Eating and drinking  Eat meals on a regular schedule.  Limit alcohol use.  Decrease the amount of caffeine you drink, or stop drinking caffeine. General instructions  Keep all follow-up visits as told by your health care provider. This is important.  Keep a headache journal to help find out what may trigger your headaches. For example, write down: ? What you eat and drink. ? How much sleep you get. ? Any change to your diet or medicines.  Try massage or other relaxation techniques.  Limit stress.  Sit up straight, and do not tense your muscles.  Do not use tobacco products, including cigarettes, chewing tobacco, or e-cigarettes. If you need help quitting, ask your health care provider.  Exercise regularly as told by your health care provider.  Sleep on a regular schedule. Get 7-9 hours of sleep, or the amount recommended by your health care provider. Contact a health care provider if:  Your symptoms are not helped by  medicine.  You have a headache that is different from the usual headache.  You have nausea or you vomit.  You have a fever. Get help right away if:  Your headache becomes severe.  You have repeated vomiting.  You have a stiff neck.  You have a loss of vision.  You have problems with speech.  You have pain in the eye or ear.  You have muscular weakness or loss of muscle control.  You lose your balance or have trouble walking.  You feel faint or pass out.  You have confusion. This information is not intended to replace advice given to you by your health care provider. Make sure you discuss any questions you have with your health care provider. Document Released: 09/20/2005 Document Revised: 02/26/2016 Document Reviewed: 01/13/2015 Elsevier Interactive Patient Education  2017 [employer]  

## 2017-05-27 ENCOUNTER — Inpatient Hospital Stay (HOSPITAL_COMMUNITY)
Admission: AD | Admit: 2017-05-27 | Discharge: 2017-05-28 | Disposition: A | Discharge status: Home / Self Care | Payer: Medicaid Other | Source: Ambulatory Visit | Attending: Obstetrics and Gynecology | Admitting: Obstetrics and Gynecology

## 2017-05-27 ENCOUNTER — Encounter (HOSPITAL_COMMUNITY): Payer: Self-pay | Admitting: *Deleted

## 2017-05-27 DIAGNOSIS — O471 False labor at or after 37 completed weeks of gestation: Secondary | ICD-10-CM

## 2017-05-27 DIAGNOSIS — Z3A37 37 weeks gestation of pregnancy: Secondary | ICD-10-CM | POA: Insufficient documentation

## 2017-05-27 DIAGNOSIS — O368390 Maternal care for abnormalities of the fetal heart rate or rhythm, unspecified trimester, not applicable or unspecified: Secondary | ICD-10-CM

## 2017-05-27 NOTE — MAU Note (Signed)
Pt states contractions every 5 mins since 7pm. Pt denies LOF or vaginal bleeding. Reports good fetal movement. States cervix was 1cm 2 weeks ago. Pt states she get [facility] in [place] but did not think she would make it there so she came here

## 2017-05-27 NOTE — Progress Notes (Signed)
Monitor for one hour then recheck for cervical change

## 2017-05-28 ENCOUNTER — Encounter (HOSPITAL_COMMUNITY): Payer: Self-pay | Admitting: *Deleted

## 2017-05-28 ENCOUNTER — Inpatient Hospital Stay (HOSPITAL_COMMUNITY): Payer: Medicaid Other

## 2017-05-28 DIAGNOSIS — Z3A37 37 weeks gestation of pregnancy: Secondary | ICD-10-CM | POA: Diagnosis not present

## 2017-05-28 NOTE — Progress Notes (Signed)
Patient here for Labor Eval, has not made any cervical change in two hours. Patient had 3 non-repetitive decelerations (in 2 hours of monitoring) with overall acels and moderate variability . BPP ordered; 8/8. Patient stable for discharge.  Luna Kitchens CNM

## 2017-05-28 NOTE — Discharge Instructions (Signed)
Fetal Movement Counts °Patient Name: ________________________________________________ Patient Due Date: ____________________ °What is a fetal movement count? °A fetal movement count is the number of times that you feel your baby move during a certain amount of time. This may also be called a fetal kick count. A fetal movement count is recommended for every pregnant woman. You may be asked to start counting fetal movements as early as week 28 of your pregnancy. °Pay attention to when your baby is most active. You may notice your baby's sleep and wake cycles. You may also notice things that make your baby move more. You should do a fetal movement count: °· When your baby is normally most active. °· At the same time each day. ° °A good time to count movements is while you are resting, after having something to eat and drink. °How do I count fetal movements? °1. Find a quiet, comfortable area. Sit, or lie down on your side. °2. Write down the date, the start time and stop time, and the number of movements that you felt between those two times. Take this information with you to your health care visits. °3. For 2 hours, count kicks, flutters, swishes, rolls, and jabs. You should feel at least 10 movements during 2 hours. °4. You may stop counting after you have felt 10 movements. °5. If you do not feel 10 movements in 2 hours, have something to eat and drink. Then, keep resting and counting for 1 hour. If you feel at least 4 movements during that hour, you may stop counting. °Contact a health care provider if: °· You feel fewer than 4 movements in 2 hours. °· Your baby is not moving like he or she usually does. °Date: ____________ Start time: ____________ Stop time: ____________ Movements: ____________ °Date: ____________ Start time: ____________ Stop time: ____________ Movements: ____________ °Date: ____________ Start time: ____________ Stop time: ____________ Movements: ____________ °Date: ____________ Start time:  ____________ Stop time: ____________ Movements: ____________ °Date: ____________ Start time: ____________ Stop time: ____________ Movements: ____________ °Date: ____________ Start time: ____________ Stop time: ____________ Movements: ____________ °Date: ____________ Start time: ____________ Stop time: ____________ Movements: ____________ °Date: ____________ Start time: ____________ Stop time: ____________ Movements: ____________ °Date: ____________ Start time: ____________ Stop time: ____________ Movements: ____________ °This information is not intended to replace advice given to you by your health care provider. Make sure you discuss any questions you have with your health care provider. °Document Released: 10/20/2006 Document Revised: 05/19/2016 Document Reviewed: 10/30/2015 °Elsevier Interactive Patient Education © 2018 [employer] °Braxton Hicks Contractions °Contractions of the uterus can occur throughout pregnancy, but they are not always a sign that you are in labor. You may have practice contractions called Braxton Hicks contractions. These false labor contractions are sometimes confused with true labor. °What are Braxton Hicks contractions? °Braxton Hicks contractions are tightening movements that occur in the muscles of the uterus before labor. Unlike true labor contractions, these contractions do not result in opening (dilation) and thinning of the cervix. Toward the end of pregnancy (32-34 weeks), Braxton Hicks contractions can happen more often and may become stronger. These contractions are sometimes difficult to tell apart from true labor because they can be very uncomfortable. You should not feel embarrassed if you go to the hospital with false labor. °Sometimes, the only way to tell if you are in true labor is for your health care provider to look for changes in the cervix. The health care provider will do a physical exam and may monitor your contractions. If   you are not in true labor, the exam  should show that your cervix is not dilating and your water has not broken. °If there are no prenatal problems or other health problems associated with your pregnancy, it is completely safe for you to be sent home with false labor. You may continue to have Braxton Hicks contractions until you go into true labor. °How can I tell the difference between true labor and false labor? °· Differences °? False labor °? Contractions last 30-70 seconds.: Contractions are usually shorter and not as strong as true labor contractions. °? Contractions become very regular.: Contractions are usually irregular. °? Discomfort is usually felt in the top of the uterus, and it spreads to the lower abdomen and low back.: Contractions are often felt in the front of the lower abdomen and in the groin. °? Contractions do not go away with walking.: Contractions may go away when you walk around or change positions while lying down. °? Contractions usually become more intense and increase in frequency.: Contractions get weaker and are shorter-lasting as time goes on. °? The cervix dilates and gets thinner.: The cervix usually does not dilate or become thin. °Follow these instructions at home: °· Take over-the-counter and prescription medicines only as told by your health care provider. °· Keep up with your usual exercises and follow other instructions from your health care provider. °· Eat and drink lightly if you think you are going into labor. °· If Braxton Hicks contractions are making you uncomfortable: °? Change your position from lying down or resting to walking, or change from walking to resting. °? Sit and rest in a tub of warm water. °? Drink enough fluid to keep your urine clear or pale yellow. Dehydration may cause these contractions. °? Do slow and deep breathing several times an hour. °· Keep all follow-up prenatal visits as told by your health care provider. This is important. °Contact a health care provider if: °· You have a  fever. °· You have continuous pain in your abdomen. °Get help right away if: °· Your contractions become stronger, more regular, and closer together. °· You have fluid leaking or gushing from your vagina. °· You pass blood-tinged mucus (bloody show). °· You have bleeding from your vagina. °· You have low back pain that you never had before. °· You feel your baby’s head pushing down and causing pelvic pressure. °· Your baby is not moving inside you as much as it used to. °Summary °· Contractions that occur before labor are called Braxton Hicks contractions, false labor, or practice contractions. °· Braxton Hicks contractions are usually shorter, weaker, farther apart, and less regular than true labor contractions. True labor contractions usually become progressively stronger and regular and they become more frequent. °· Manage discomfort from Braxton Hicks contractions by changing position, resting in a warm bath, drinking plenty of water, or practicing deep breathing. °This information is not intended to replace advice given to you by your health care provider. Make sure you discuss any questions you have with your health care provider. °Document Released: 09/20/2005 Document Revised: 08/09/2016 Document Reviewed: 08/09/2016 °Elsevier Interactive Patient Education © 2017 [employer] ° °

## 2017-05-31 ENCOUNTER — Encounter (HOSPITAL_COMMUNITY): Payer: Self-pay

## 2017-05-31 ENCOUNTER — Inpatient Hospital Stay (HOSPITAL_COMMUNITY)
Admission: AD | Admit: 2017-05-31 | Discharge: 2017-05-31 | Disposition: A | Discharge status: Home / Self Care | Payer: Medicaid Other | Source: Ambulatory Visit | Attending: Family Medicine | Admitting: Family Medicine

## 2017-05-31 DIAGNOSIS — Z3A00 Weeks of gestation of pregnancy not specified: Secondary | ICD-10-CM | POA: Diagnosis not present

## 2017-05-31 DIAGNOSIS — O479 False labor, unspecified: Secondary | ICD-10-CM | POA: Diagnosis not present

## 2017-05-31 LAB — GLUCOSE, CAPILLARY: GLUCOSE-CAPILLARY: 82 mg/dL (ref 65–99)

## 2017-05-31 NOTE — MAU Note (Signed)
Is contracting like 3-4 min apart.  Was 4 cm this morning at dr's office. No bleeding or leaking.  Is to be induced on Monday.  Does not have a way to get to [facility] today.

## 2017-05-31 NOTE — Discharge Instructions (Signed)

## 2017-09-05 ENCOUNTER — Encounter (HOSPITAL_COMMUNITY): Payer: Self-pay

## 2017-09-08 IMAGING — CR DG CHEST 2V
2 series · 2 of 2 positions shown · non-contrast
Comparison: None.

CLINICAL DATA: Acute onset of dry hacking cough and shortness of
breath. Initial encounter.

EXAM:
CHEST  2 VIEW

[chest pa]
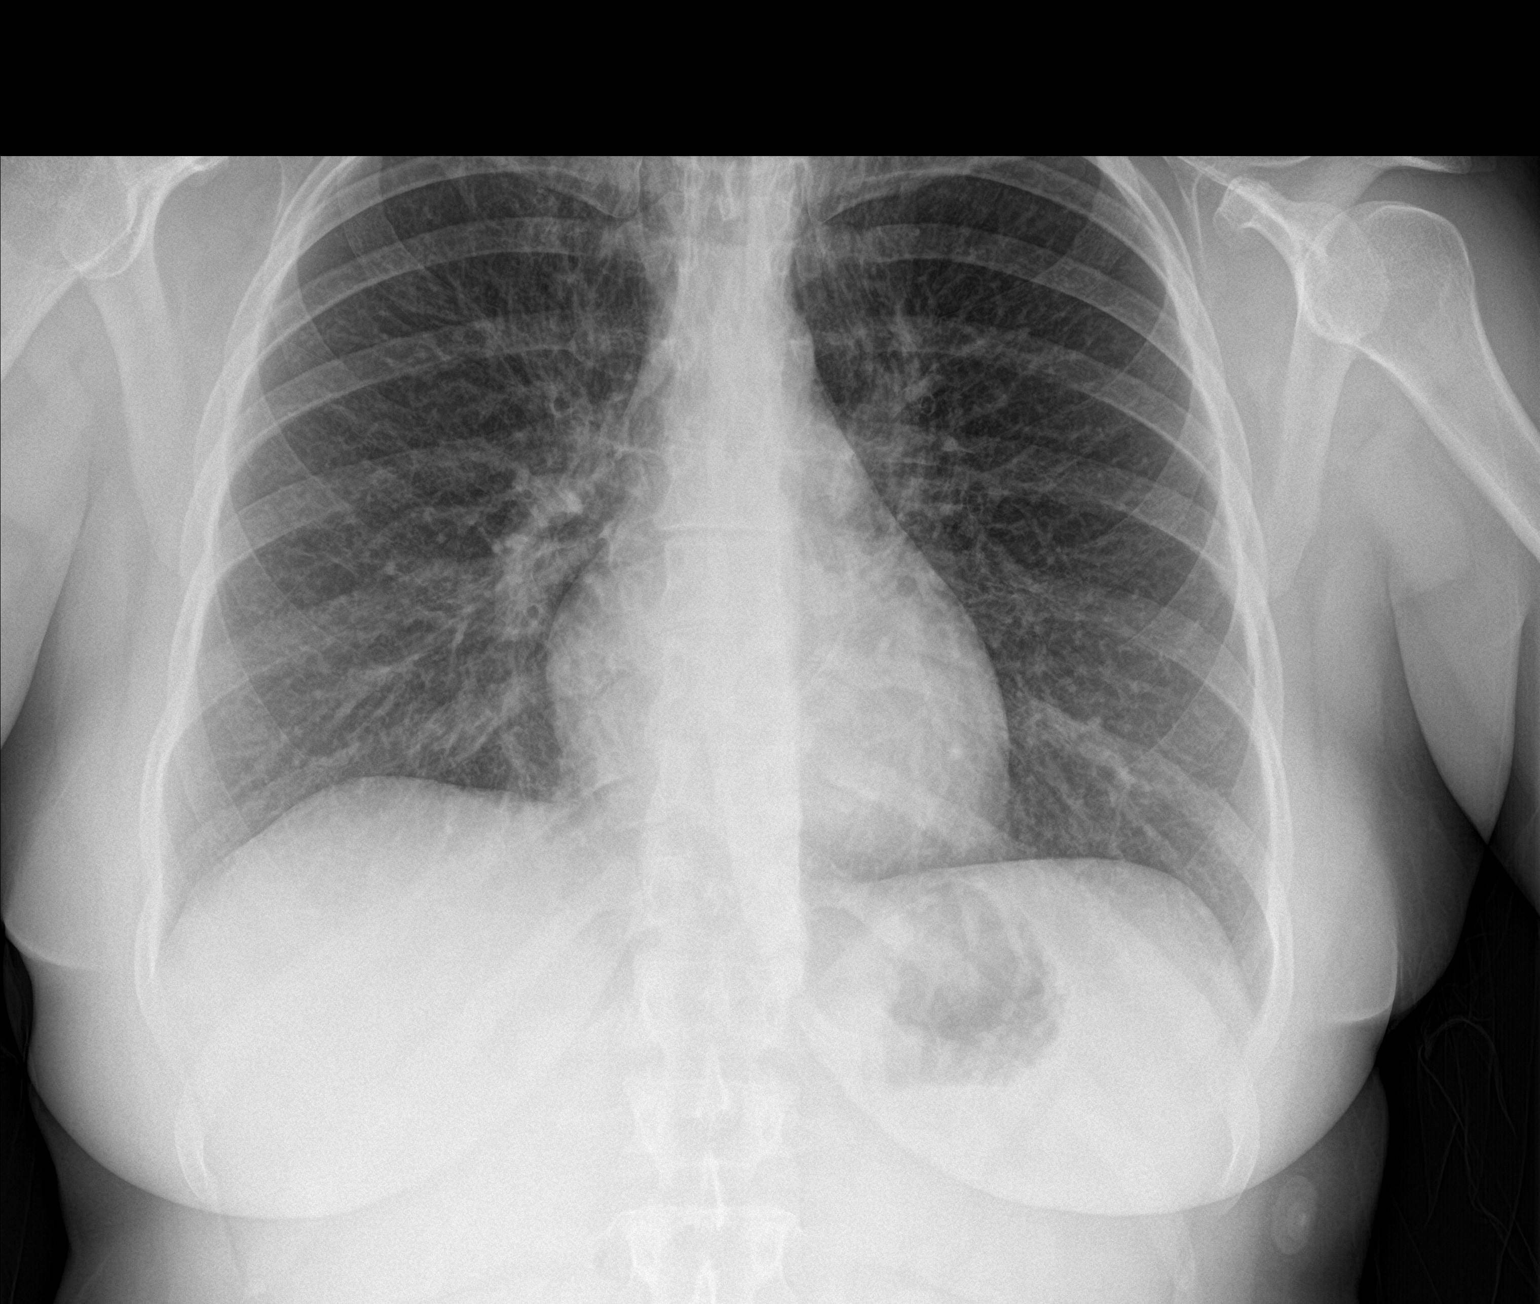

[chest lat]
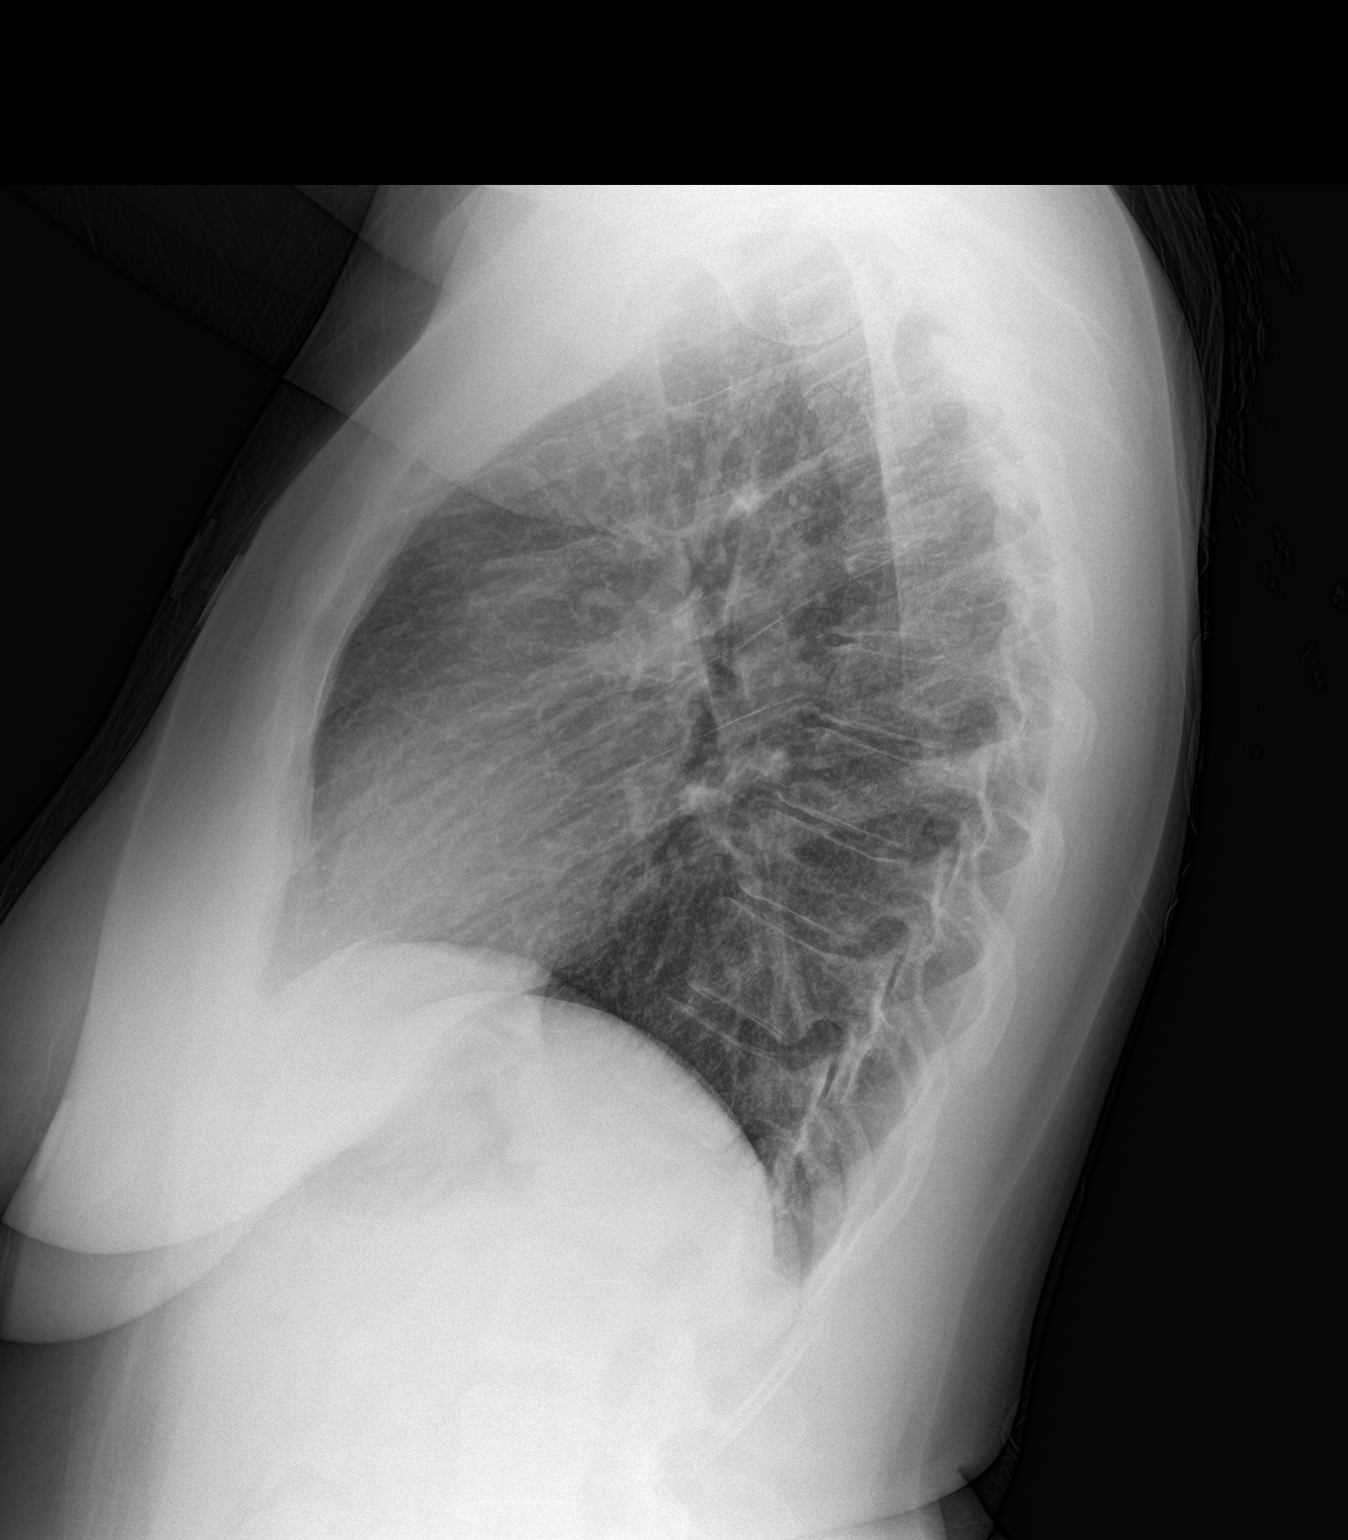

[2 of 2 positions shown; findings below may reference images not displayed]

FINDINGS: The lungs are well-aerated. Mild peribronchial thickening is noted.
There is no evidence of focal opacification, pleural effusion or
pneumothorax.

The heart is normal in size; the mediastinal contour is within
normal limits. No acute osseous abnormalities are seen.
IMPRESSION: Mild peribronchial thickening noted.  Lungs otherwise clear.

## 2018-06-07 ENCOUNTER — Other Ambulatory Visit: Payer: Self-pay | Admitting: Advanced Practice Midwife

## 2018-06-12 ENCOUNTER — Other Ambulatory Visit: Payer: Self-pay | Admitting: Student

## 2018-06-12 DIAGNOSIS — Z1231 Encounter for screening mammogram for malignant neoplasm of breast: Secondary | ICD-10-CM
